# Patient Record
Sex: Female | Born: 1970 | Race: White | Hispanic: No | Smoking: Current every day smoker
Health system: Southern US, Community
[De-identification: ages and names within clinical notes are randomized; demographics above are authoritative.]

## PROBLEM LIST (undated history)

## (undated) DIAGNOSIS — F319 Bipolar disorder, unspecified: Secondary | ICD-10-CM

## (undated) DIAGNOSIS — I10 Essential (primary) hypertension: Secondary | ICD-10-CM

## (undated) DIAGNOSIS — E039 Hypothyroidism, unspecified: Secondary | ICD-10-CM

## (undated) HISTORY — PX: TONSILLECTOMY: SUR1361

## (undated) HISTORY — PX: CHOLECYSTECTOMY: SHX55

## (undated) HISTORY — DX: Hypothyroidism, unspecified: E03.9

---

## 2006-05-31 ENCOUNTER — Ambulatory Visit: Payer: Self-pay | Admitting: Gastroenterology

## 2006-06-07 ENCOUNTER — Ambulatory Visit (HOSPITAL_COMMUNITY): Admission: RE | Admit: 2006-06-07 | Discharge: 2006-06-07 | Payer: Self-pay | Admitting: Gastroenterology

## 2006-06-08 ENCOUNTER — Ambulatory Visit: Payer: Self-pay | Admitting: Internal Medicine

## 2006-06-08 ENCOUNTER — Ambulatory Visit (HOSPITAL_COMMUNITY): Admission: RE | Admit: 2006-06-08 | Discharge: 2006-06-08 | Payer: Self-pay | Admitting: Internal Medicine

## 2006-06-14 ENCOUNTER — Encounter (INDEPENDENT_AMBULATORY_CARE_PROVIDER_SITE_OTHER): Payer: Self-pay | Admitting: General Surgery

## 2006-06-14 ENCOUNTER — Ambulatory Visit (HOSPITAL_COMMUNITY): Admission: RE | Admit: 2006-06-14 | Discharge: 2006-06-14 | Payer: Self-pay | Admitting: General Surgery

## 2006-09-29 ENCOUNTER — Ambulatory Visit (HOSPITAL_COMMUNITY): Admission: RE | Admit: 2006-09-29 | Discharge: 2006-09-29 | Payer: Self-pay | Admitting: Gastroenterology

## 2006-09-29 ENCOUNTER — Ambulatory Visit: Payer: Self-pay | Admitting: Gastroenterology

## 2007-04-19 ENCOUNTER — Ambulatory Visit: Payer: Self-pay | Admitting: Gastroenterology

## 2007-11-17 ENCOUNTER — Encounter: Admission: RE | Admit: 2007-11-17 | Discharge: 2007-11-17 | Payer: Self-pay | Admitting: Neurosurgery

## 2009-01-22 DIAGNOSIS — K2990 Gastroduodenitis, unspecified, without bleeding: Secondary | ICD-10-CM

## 2009-01-22 DIAGNOSIS — G43909 Migraine, unspecified, not intractable, without status migrainosus: Secondary | ICD-10-CM | POA: Insufficient documentation

## 2009-01-22 DIAGNOSIS — D649 Anemia, unspecified: Secondary | ICD-10-CM

## 2009-01-22 DIAGNOSIS — F329 Major depressive disorder, single episode, unspecified: Secondary | ICD-10-CM | POA: Insufficient documentation

## 2009-01-22 DIAGNOSIS — K2970 Gastritis, unspecified, without bleeding: Secondary | ICD-10-CM | POA: Insufficient documentation

## 2009-01-22 DIAGNOSIS — K8066 Calculus of gallbladder and bile duct with acute and chronic cholecystitis without obstruction: Secondary | ICD-10-CM | POA: Insufficient documentation

## 2009-01-22 DIAGNOSIS — Z8719 Personal history of other diseases of the digestive system: Secondary | ICD-10-CM

## 2009-01-22 DIAGNOSIS — K449 Diaphragmatic hernia without obstruction or gangrene: Secondary | ICD-10-CM | POA: Insufficient documentation

## 2009-01-28 ENCOUNTER — Ambulatory Visit: Payer: Self-pay | Admitting: Gastroenterology

## 2009-01-28 ENCOUNTER — Encounter: Payer: Self-pay | Admitting: Gastroenterology

## 2009-01-28 DIAGNOSIS — R1011 Right upper quadrant pain: Secondary | ICD-10-CM

## 2009-01-28 DIAGNOSIS — R142 Eructation: Secondary | ICD-10-CM

## 2009-01-28 DIAGNOSIS — R143 Flatulence: Secondary | ICD-10-CM

## 2009-01-28 DIAGNOSIS — R141 Gas pain: Secondary | ICD-10-CM

## 2009-01-29 ENCOUNTER — Encounter: Payer: Self-pay | Admitting: Internal Medicine

## 2009-01-29 LAB — CONVERTED CEMR LAB
ALT: 22 units/L (ref 0–35)
AST: 14 units/L (ref 0–37)
Albumin: 4.4 g/dL (ref 3.5–5.2)
Alkaline Phosphatase: 89 units/L (ref 39–117)
Basophils Relative: 1 % (ref 0–1)
Eosinophils Relative: 3 % (ref 0–5)
Lymphs Abs: 3.5 10*3/uL (ref 0.7–4.0)
MCHC: 33.2 g/dL (ref 30.0–36.0)
MCV: 94.7 fL (ref 78.0–100.0)
Monocytes Absolute: 0.3 10*3/uL (ref 0.1–1.0)
Monocytes Relative: 3 % (ref 3–12)
Neutro Abs: 5.7 10*3/uL (ref 1.7–7.7)
Neutrophils Relative %: 58 % (ref 43–77)
Platelets: 339 10*3/uL (ref 150–400)
Potassium: 4.7 meq/L (ref 3.5–5.3)
RDW: 13.6 % (ref 11.5–15.5)
WBC: 9.9 10*3/uL (ref 4.0–10.5)

## 2009-01-31 ENCOUNTER — Ambulatory Visit (HOSPITAL_COMMUNITY): Admission: RE | Admit: 2009-01-31 | Discharge: 2009-01-31 | Payer: Self-pay | Admitting: Internal Medicine

## 2009-03-27 ENCOUNTER — Encounter (INDEPENDENT_AMBULATORY_CARE_PROVIDER_SITE_OTHER): Payer: Self-pay | Admitting: *Deleted

## 2010-02-25 NOTE — Letter (Signed)
Summary: CT SCAN OF THE ABD/PELVIS ORDER  CT SCAN OF THE ABD/PELVIS ORDER   Imported By: Ave Filter 01/29/2009 12:45:50  _____________________________________________________________________  External Attachment:    Type:   Image     Comment:   External Document

## 2010-02-25 NOTE — Assessment & Plan Note (Signed)
Summary: ABD PAIN/CM   Visit Type:  F/U Primary Care Provider:  Ninfa Linden  Chief Complaint:  abdominal pain.  History of Present Illness: Miss Eileen [name] is a pleasant 40 year old Caucasian female who presents today for further evaluation of abdominal pain. She was last seen in March of 2009 for abd pain. She was seen by Dr. Darrick Penna and was treated for functional gut disorder and gastritis. Hyoscyamine did not help. 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 She had laparoscopic cholecystectomy in May of 2008, on pathology showed chronic cholecystitis and cholelithiasis.  She c/o intermittent RUQ pain which has been worse the last few months. She feels like a knot and swelling in this area and feels a burning sensation. She started on Carafate in November and seems to help some. Pain is worse postprandially but it doesn't matter what she eats. She denies heartburn. She's taking Prilosec 20mg  every morning but eats five hours later. She has a bowel movement every other day but feels bloated. She denies having hard stools. She takes MiraLax daily. Denies blood in the stool or melena. She denies prior colonoscopy.  Current Medications (verified): 1)  Effexor Xr 150 Mg Xr24h-Cap (Venlafaxine Hcl) .... Take 3 Tablets Daily 2)  Lamictal 100 Mg Tabs (Lamotrigine) .... Three Tablets Daily 3)  Tylenol 325 Mg Tabs (Acetaminophen) .... As Needed 4)  Prilosec 20 Mg Cpdr (Omeprazole) .... Take 1 Tablet By Mouth Once A Day 5)  Asa 81 Mg .... Take 1 Tablet By Mouth Once A Day 6)  Carafate 1 Gm Tabs (Sucralfate) .... Take 1 Tablet By Mouth Four Times A Day 7)  Vitamin D .... 50,000 Units Twice Per Week 8)  Glycolax  Powd (Polyethylene Glycol 3350) .Marland KitchenMarland KitchenMarland Kitchen 17 Grams By Mouth Daily  Allergies (verified): 1)  ! Penicillin  Past History:  Past Surgical History: Last updated: 01/22/2009 LAPAROSCOPIC CHOLECYSTECTOMY  2008 BILATERAL TUBAL LIGATION TONSILLECTOMY  Past Medical History: Bipolar disorder GERD EGD, April 2008, Dr. Samuella Cota, reported to show gastritis.  Biopsy was negative H. Pylori. ERCP with biliary sphincterotomy, placement of biliary and pancreatic stents on May 2008. She had large stone in the neck of the gallbladder causing biliary obstruction via external compression, Mirrizi's syndrome. Sphincterotomy was performed and placement of both biliary and pancreatic stents. EGD with removal of bile duct stent, September 2008 Dr. Kassie Mends. Bile duct stent was removed from the ampulla. Pancreatic duct stent had passed on its own. Esophagus was normal. She had a 2-3 cm hiatal hernia. Normal duodenal bulb and second portion of the duodenum.  Family History: Father, pancreatitis, unknown cause Mother, stomach issues,breast cancer age 72 Brother, stomach issues No known FH of CRC.  Social History: Widowed. Four children 16, 13, 11, 10 Tob 1ppd.  Etoh 3-4 times per year. Marijuana use daily. Unemployed, stay-at-home mom  Review of Systems General:  Denies fever, chills, fatigue, weakness, malaise, and weight loss. Eyes:  Denies vision loss. ENT:  Denies nasal congestion, sore throat, hoarseness, and difficulty swallowing. CV:  Denies chest pains, angina, syncope, dyspnea on exertion, and peripheral edema. Resp:  Denies dyspnea at rest, dyspnea with exercise, and cough. GI:  See HPI. GU:  Denies urinary burning and blood in urine. MS:  Denies joint pain / LOM. Derm:  Denies rash and itching. Neuro:  Denies weakness, paralysis, abnormal sensation, frequent headaches, memory loss, and confusion. Psych:  Denies depression and anxiety. Endo:  Denies unusual weight change. Heme:  Denies bruising and bleeding. Allergy:  Denies hives and rash.  Vital Signs:  Patient profile:   40 year old female Height:      64 inches Weight:      191 pounds BMI:     32.90 Temp:     98.0 degrees F  oral Pulse rate:   60 / minute BP sitting:   122 / 82  (left arm) Cuff size:   regular  Vitals Entered By: Cloria Spring LPN (January 28, 2009 11:01 AM)  Physical Exam  General:  Well developed, well nourished, no acute distress. Head:  Normocephalic and atraumatic. Eyes:  Conjunctivae pink, no scleral icterus.  Mouth:  Oropharyngeal mucosa moist, pink.  No lesions, erythema or exudate.    Neck:  Supple; no masses or thyromegaly. Lungs:  Clear throughout to auscultation. Heart:  Regular rate and rhythm; no murmurs, rubs,  or bruits. Abdomen:  Abd obese. Soft. Normal bowel sounds.  Moderate RUQ tenderness. No masses or hernia noted. No abd bruits. No rebound or guarding. Extremities:  No clubbing, cyanosis, edema or deformities noted. Neurologic:  Alert and  oriented x4;  grossly normal neurologically. Skin:  Intact without significant lesions or rashes. Cervical Nodes:  No significant cervical adenopathy. Psych:  Alert and cooperative. Normal mood and affect.  Impression & Recommendations:  Problem # 1:  ABDOMINAL PAIN RIGHT UPPER QUADRANT (ICD-789.01) Worsening RUQ tenderness with h/o previous cholecystectomy and ERCP as above.  Last EGD in 2009 unremarkable.  Also with c/o RUQ swelling and abd bloating.  Given chronicity of symptoms and lack of response to PPI and antispasmotics, will pursue further w/u.  Will increase Prilosec.  Advised to take before meals.  Orders: Est. Patient Level IV (27253) T-CBC w/Diff (66440-34742) T-Comprehensive Metabolic Panel [phone]) T-Lipase (33295-18841)  Problem # 2:  CONSTIPATION, HX OF (ICD-V12.79) Continue Miralax.  Increase dietary fiber and consume adequate noncaffeinated beverages daily.  Encouraged daily excersize. Add Restora for three weeks.  Orders: Est. Patient Level IV (66063) T-CBC w/Diff (01601-09323) T-Comprehensive Metabolic Panel (55732-20254)  Patient Instructions: 1)  Please have labwork done today. 2)  CT  abdomen/pelvis as scheduled. 3)  Take Restora once daily. Samples given. 4)  Take Prilosec twice a day.  Samples given.  New prescription sent to [facility]. 5)  The medication list was reviewed and reconciled.  All changed / newly prescribed medications were explained.  A complete medication list was provided to the patient / caregiver. Prescriptions: PRILOSEC 20 MG CPDR (OMEPRAZOLE) Take 1 tablet by mouth 30 mins before breakfast and before evening meal  #60 x 11   Entered and Authorized by:   Leanna Battles. Dixon Boos   Signed by:   Leanna Battles Dixon Boos on 01/28/2009   Method used:   Electronically to        [facility] (retail)       [address]       [place], [place]  [address]       Ph: [phone]       Fax: [phone]   RxID:   [id number]

## 2010-02-25 NOTE — Letter (Signed)
Summary: Appointment Reminder  [facility]  [address]   [place], [place] [address]   Phone: [phone]  Fax: [phone]       March 27, 2009   [facility] [address] [place], [place]  [address] Jun 07, 1970    Dear Ms. Soave,  We have been unable to reach you by phone to schedule a follow up   appointment that was recommended for you by Dr. Darrick Penna. It is very   important that we reach you to schedule an appointment. We hope that you  allow [place] to participate in your health care needs. Please contact us at  [phone] at your earliest convenience to schedule your appointment.  Sincerely,    [facility] R. Roetta Sessions, M.D.    Kassie Mends, M.D. Lorenza Burton, FNP-BC    Tana Coast, PA-C Phone: [phone]    Fax: [phone]

## 2010-06-10 NOTE — Assessment & Plan Note (Signed)
Eileen [name], Eileen [name]                  CHART#:  [id number]   DATE:  04/19/2007                       DOB:  11/04/1970   PROBLEM LIST:  1. 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  2. Laparoscopic cholecystectomy in May of 2008 and pathology showed      chronic cholecystitis and cholelithiasis.  3. Hiatal hernia.   SUBJECTIVE:  The patient is a 40 year old female who was last seen in  September of 2008.  She was seen for an upper endoscopy to have her bile  duct stent removed.  It was placed in May of 2008 and removed in  September of 2008 because of her personal reasons.  She presents today  complaining of abdominal pain which is off and on and usually associated  with stress.  It flares up when her four children are bothering her.  She feels it in the right upper quadrant and sometimes in the right  chest.  It is sharp.  It does not radiate and is associated with nausea.  Sometimes she vomits.  Crackers help with the nausea.  She denies any  fever or diarrhea.  She also complains of bloating after she eats.  Liquids do not make her feel bloated.  She stays constipated and  occasionally needs to use a laxative.  She has one bowel movement every  3-4 days.  She denies any blood in her stool or black tarry stools.  She  does not use BC or Goody Powders.  She uses 7-8 ibuprofen per week.  She  denies any heartburn or indigestion.  She says she has had problems  swallowing since her tonsils were out 3-4 years ago.  She has had no  emergency room visits.  Her pain can last from 20 minutes to hours.  She  usually just lays down and it gets better.   MEDICATIONS:  1. Effexor.  2. Lamictal.  3. Clonazepam as needed.  4. Zyprexa.  5. Vitamin D.  6. Tylenol as needed.   OBJECTIVE:  VITAL SIGNS:  Weight 152 pounds (up 10 pounds since April of  2008), height 5 feet 4 inches, BMI 26.1 (slightly overweight),  temperature 98.1, blood pressure 112/78, and pulse 68.  GENERAL:  She is  in no apparent distress.  Alert and oriented x4.  LUNGS:  Clear to  auscultation bilaterally. CARDIOVASCULAR:  Regular rhythm.  No murmur.  ABDOMEN:  Bowel sounds are present, soft, mildly distended, nontender,  and no rebound or guarding.   ASSESSMENT:  The patient is a 40 year old female who likely has a  functional gut disorder.  She may have gastritis exacerbating her pain.  The episodes are mainly precipitated by stress, but she does have some  postprandial bloating.  Thank you for allowing me to see the patient in  consultation.   RECOMMENDATIONS:  1. She is to add Prilosec once daily for gastrointestinal prophylaxis      with continued NSAIDS use.  2. She should avoid gastric irritants.  She is given a handout on      gastric irritants.  3. She is to use hyoscyamine under her tongue if she is in a stressful      situation or if she  has abdominal pain.  I did warn her that the      side effects include constipation, drowsiness, dry eyes, dry mouth,      urinary retention.  4. Return visit in 2 months.       Kassie Mends, M.D.  Electronically Signed     SM/MEDQ  D:  04/19/2007  T:  04/20/2007  Job:  [id number]

## 2010-06-10 NOTE — H&P (Signed)
NAME:  Eileen [name], Eileen [name]                 ACCOUNT NO.:  [id number]   MEDICAL RECORD NO.:  [id number]          PATIENT TYPE:  AMB   LOCATION:  DAY                           FACILITY:  [facility]   PHYSICIAN:  Dalia Heading, M.D.  DATE OF BIRTH:  1970-06-22   DATE OF ADMISSION:  DATE OF DISCHARGE:  LH                              HISTORY & PHYSICAL   CHIEF COMPLAINT:  Cholecystitis, cholelithiasis.   HISTORY OF PRESENT ILLNESS:  The patient is a 40 year old white female  who is referred for evaluation and treatment of biliary colic secondary  to cholelithiasis.  She has been having right upper quadrant abdominal  pain, nausea and bloating for one week.  She underwent ERCP with stent  placement by Dr. Karilyn Cota earlier this week and was found to have  Mirizzi's syndrome.  Some fatty food intolerance is noted.  No fever,  chills, jaundice has been noted.   PAST MEDICAL HISTORY:  Anxiety disorder.   PAST SURGICAL HISTORY:  Tubal ligation, tonsillectomy.   CURRENT MEDICATIONS:  Effexor, Lamictal, Clonazepam, oxycodone.   ALLERGIES:  PENICILLIN which she states does not work with her.   REVIEW OF SYSTEMS:  The patient smokes two packs of cigarettes a day.  She drinks alcohol socially.  She denies any other cardiopulmonary  difficulties or bleeding disorders.   PHYSICAL EXAMINATION:  GENERAL:  The patient is a well-developed, well-  nourished white female in no acute distress.  HEENT:  Reveals no scleral icterus.  LUNGS:  Clear to auscultation with equal breath sounds bilaterally.  HEART:  Examination reveals a regular rate and rhythm without S3, S4 or  murmurs.  ABDOMEN:  Soft and non-distended.  She is tender in the right upper  quadrant to palpation.  No hepatosplenomegaly, masses, or hernias are  noted.   Ultrasound reveals cholelithiasis with a dilated common bile duct which  has been treated.   IMPRESSION:  Cholecystitis, cholelithiasis.   PLAN:  The patient is scheduled for  laparoscopic cholecystectomy on  06/14/06.  Risks and benefits of the procedure including bleeding,  infection, hepatobiliary injury, the possibility of an open procedure  were fully explained to the patient who gave informed consent.      Dalia Heading, M.D.  Electronically Signed     MAJ/MEDQ  D:  06/10/2006  T:  06/10/2006  Job:  [id number]   cc:   Lionel December, M.D.  [address]  [place]  [place] [address]

## 2010-06-10 NOTE — Op Note (Signed)
NAME:  [name], Eileen                 ACCOUNT NO.:  [id number]   MEDICAL RECORD NO.:  [id number]          PATIENT TYPE:  AMB   LOCATION:  DAY                           FACILITY:  [facility]   PHYSICIAN:  Dalia Heading, M.D.  DATE OF BIRTH:  08/14/1970   DATE OF PROCEDURE:  06/14/2006  DATE OF DISCHARGE:                               OPERATIVE REPORT   PREOPERATIVE DIAGNOSIS:  Cholecystitis, cholelithiasis.   POSTOPERATIVE DIAGNOSIS:  Cholecystitis, cholelithiasis.   PROCEDURE:  Laparoscopic cholecystectomy.   SURGEON:  Dr. Franky Macho.   ANESTHESIA:  General endotracheal.   INDICATIONS:  The patient is a 40 year old white female status post ERCP  with stent placement for Mirizzi's syndrome.  The patient now comes to  the operating room for laparoscopic cholecystectomy.  The risks and  benefits of the procedure including bleeding, infection, hepatobiliary  injury, and the possibly of an open procedure were fully explained to  the patient.  I gave informed consent.   PROCEDURE NOTE:  The patient was placed in the supine position.  After  induction of general endotracheal anesthesia, the abdomen was prepped  and draped using the usual sterile technique with Betadine.  Surgical  site confirmation was performed.   An infraumbilical incision was made down to the fascia.  Veress needle  was introduced into the abdominal cavity, and confirmation of placement  was done using the saline drop test.  The abdomen was then insufflated  to 16 mmHg pressure.  An 11-mm trocar was introduced into the abdominal  cavity under direct visualization without difficulty.  The patient was  placed in a reversed Trendelenburg position.  An additional 11-mm trocar  was placed in the epigastric region, and 5-mm trocars were placed in the  right upper quadrant and right flank regions.  The liver was inspected  and noted to be within normal limits.  The gallbladder was retracted  superior and laterally.  The  dissection was begun around the  infundibulum of the gallbladder.  A large stone was noted be impacted in  the neck of the gallbladder.  It was freed up to the lumen of the  fundus.  The cystic duct was first identified.  The juncture to the  infundibulum was fully identified.  A standard Endo-GIA was placed  across the cystic duct to its significant size.  The common duct was  visualized during the firing of the GIA.  The cystic artery was likewise  ligated and divided.  The gallbladder was freed away from the  gallbladder fossa using Bovie electrocautery.  The gallbladder was  delivered into the epigastric trocar site using an EndoCatch bag.  The  gallbladder fossa was inspected.  No abnormal bleeding or bile leakage  was noted.  Surgicel was placed in the gallbladder fossa.  All fluid and  air were then evacuated from the abdominal cavity prior to removal of  the trocars.   All wounds were irrigated normal saline.  All wounds were injected with  0.5 % Sensorcaine.  The infraumbilical fascia as well as epigastric  fascia were reapproximated using 0 Vicryl  interrupted sutures.  All skin  incisions were closed using staples.  Betadine ointment and dry sterile  dressings were applied.   All tape and needle counts were correct at the end of the procedure.  The patient was extubated in the operating room and went back to  recovery room, awake in stable condition.   COMPLICATIONS:  None.   SPECIMEN:  Gallbladder stone.   BLOOD LOSS:  Minimal.      Dalia Heading, M.D.  Electronically Signed     MAJ/MEDQ  D:  06/14/2006  T:  06/14/2006  Job:  [id number]   cc:   Lionel December, M.D.  [address]  [place]  [place] [address]

## 2010-06-10 NOTE — Op Note (Signed)
NAME:  Sia, Jeri                 ACCOUNT NO.:  [id number]   MEDICAL RECORD NO.:  [id number]          PATIENT TYPE:  AMB   LOCATION:  DAY                           FACILITY:  [facility]   PHYSICIAN:  Lionel December, M.D.    DATE OF BIRTH:  February 10, 1970   DATE OF PROCEDURE:  06/08/2006  DATE OF DISCHARGE:                               OPERATIVE REPORT   PROCEDURE:  ERCP with biliary sphincterotomy, placement of biliary and  pancreatic stents.   INDICATION:  Justeen is a 40 year old Caucasian female who was seen in  the office by Dr. Cira Servant last week.  She has been having right upper  quadrant pain.  She had ultrasound done in [place], [place] and noted  to have cholelithiasis.  She had lab studies yesterday.  She had  abdominal CT yesterday which revealed dilated intrahepatic and common  hepatic duct with a large calcified stone. CBD below this was normal.  She also had small stones in gallbladder.  The patient was advised  admission because of pain control but she had abdominal CT yesterday  which shows dilated intrahepatic system as well as dilated common  hepatic duct with large stone. Distal CBD was normal.  Also had small  dilated intrahepatic biliary radicals, dilated common hepatic duct with  a stone which appears to be in the distal hepatic duct.  The CBD was  normal.  She is therefore undergoing a therapeutic ERCP.  I reviewed the  procedure and risks with the patient at length.  I told her that the  pancreatic stenting may be needed to reduce the risk of pancreatitis if  procedure was difficult.  A biliary stenting would be considered if the  stone cannot be removed.  Procedure risks were reviewed with the patient  in length and she is agreeable.   MEDS FOR CONSCIOUS SEDATION:  Please see anesthesia record for details.  She is agreeable.   ANESTHESIA:  Please see anesthesia record for details.   FINDINGS:  Procedure performed in the OR.  The patient was given general  anesthesia. She was intubated and positioned in semi prone position.  Therapeutic Pentax video duodenoscope was passed via oropharynx without  any difficulty into esophagus and stomach.  Mucosa of antrum and pyloric  channel was normal.  Scope was passed across the pylorus into bulb and  descending duodenum.  Ampulla Vater was normal.  Cannulation was  attempted with RX44 autotome and 0.35 high-torque Jag wire.  The  pancreatic duct was initially cannulated and gently filled with dilute  contrast and had normal caliber.  Selective cannulation of bile duct was  difficult.  It was initially attempted leaving the guidewire in the  pancreatic duct but did not work.  CBD was finally selectively  cannulated in a relatively Quang position.  Dilute contrast was injected.  The distal CBD was normal.  There was filling defect at the junction.  There was spilling of contrast into the cystic duct.  This filling  defect was just proximal to entry of the cystic duct into the bile duct.  This filling defect appeared  to be in the cystic duct or neck of the  gallbladder rather than the bile duct.  Guidewire was passed proximal to  this area and catheter was advanced and more contrast injected outlining  the intrahepatic biliary radicals and hepatic duct. Both the  intrahepatic system and CHD were markedly dilated.  Sphincterotomy was  performed dormia basket was trolled through the common hepatic duct and  bile duct in open position but no stone was removed.  With this maneuver  the stone actually moved away from the bile duct and this was well  documented on the x-ray images.  Given this finding I proceeded with  placement of biliary stents.  10-French 10-cm Barra stent was placed  using the one-step Microvasive system.  Proximal margin of the stent was  well above the level of obstruction.  Subsequently a pancreatic duct was  cannulated and 5 cm Mcquerry single placed at level of obstruction.  Subsequently  pancreatic duct was recannulated and 4 French 5 cm single  pigtail stent was placed across the pancreatic sphincter.  Endoscope was  withdrawn.  The patient tolerated the procedure well.   FINAL DIAGNOSIS:  The patient has a large stone which is in the neck of  the gallbladder causing biliary obstruction via external compression.  The patient has Mirizzi's syndrome.  Sphincterotomy performed and 10-  French 10-cm Mena biliary stent placed to alleviate biliary obstruction.   4-French 5-cm Flakes single pigtail pancreatic stent placed to reduce the  risk of pancreatitis.   RECOMMENDATIONS:  The patient will go home if she does well in recovery,  otherwise will be admitted overnight.  We will make arrangements for her  to see a surgeon for cholecystectomy once she had decided where she  would like to have surgery.  I would recommend removing the biliary  stent following cholecystectomy.  Pancreatic stent will be removed in 2  weeks unless it passes spontaneously.      Lionel December, M.D.  Electronically Signed     NR/MEDQ  D:  06/08/2006  T:  06/09/2006  Job:  [id number]

## 2010-06-10 NOTE — Op Note (Signed)
NAME:  Eileen [name], Eileen [name]                 ACCOUNT NO.:  [id number]   MEDICAL RECORD NO.:  [id number]          PATIENT TYPE:  AMB   LOCATION:  DAY                           FACILITY:  [facility]   PHYSICIAN:  Kassie Mends, M.D.      DATE OF BIRTH:  05/22/1970   DATE OF PROCEDURE:  09/29/2006  DATE OF DISCHARGE:                               OPERATIVE REPORT   PROCEDURE:  Esophagogastroduodenoscopy with removal of bile duct stent.   INDICATION FOR EXAM:  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  She had a 10-French 10 cm stent  placed.  She also had a pancreatic duct stent placed.  Due to personal  reasons, she was unable to return to have her stent removed before now.  She presents today for bile duct stent removed.   FINDINGS:  1. Bile duct stent extending from the ampulla.  No pancreatic duct      stent seen.  Bile duct stent removed via rat tooth forceps.  Unable      to place a snare around the end of the stent.  Prior to and after      the bile duct stent was removed, bile staining seen in the second      portion of the duodenum.  2. Normal esophagus without evidence of Barrett's, mass, stricture,      erosions or ulcerations.  3. 2-3 cm hiatal hernia.  Otherwise, normal stomach.  4. Normal duodenal bulb and second portion of the duodenum.   RECOMMENDATIONS:  1. She may resume her previous diet.  2. Follow up with Dr. Kassie Mends as needed.   MEDICATIONS:  1. Demerol 100 mg IV.  2. Versed 7 mg IV.   PROCEDURE TECHNIQUE:  Physical exam was performed.  Informed consent was  obtained from the patient after explaining the benefits, risks and  alternatives to the procedure.  The patient was connected to the monitor  and placed in the left lateral position.  Continuous oxygen was provided  by nasal cannula and IV medicine administered through an indwelling  cannula.  After administration of sedation, the patient's esophagus was  intubated and  the  scope was advanced under direct visualization to the second portion of  the duodenum.  The scope was removed slowly by carefully examining the  color, texture, anatomy and integrity of the mucosa on the way out after  securing the bile duct stent.  The patient was recovered in endoscopy  and discharged home in satisfactory condition.      Kassie Mends, M.D.  Electronically Signed     SM/MEDQ  D:  09/29/2006  T:  09/29/2006  Job:  [id number]

## 2010-08-14 ENCOUNTER — Ambulatory Visit: Payer: Medicaid Other | Attending: Nurse Practitioner

## 2010-08-14 DIAGNOSIS — Z6834 Body mass index (BMI) 34.0-34.9, adult: Secondary | ICD-10-CM | POA: Insufficient documentation

## 2010-08-14 DIAGNOSIS — G4730 Sleep apnea, unspecified: Secondary | ICD-10-CM | POA: Insufficient documentation

## 2010-08-14 DIAGNOSIS — G4710 Hypersomnia, unspecified: Secondary | ICD-10-CM | POA: Insufficient documentation

## 2010-08-21 NOTE — Procedures (Signed)
NAME:  Raborn, Aleiya                 ACCOUNT NO.:  [id number]  MEDICAL RECORD NO.:  [id number]          PATIENT TYPE:  OUT  LOCATION:  SLEEP LAB                     FACILITY:  [facility]  PHYSICIAN:  Bleu Minerd A. Gerilyn Pilgrim, M.D. DATE OF BIRTH:  01-12-1971  DATE OF STUDY:  08/14/2010                           NOCTURNAL POLYSOMNOGRAM  REFERRING PHYSICIAN:  Ninfa Linden, FNP  INDICATIONS:  40 year old presents with fatigue, hypersomnia, weight gain and snoring.  This has been done to evaluate for obstructive sleep apnea syndrome.  MEDICATIONS:  Levothyroxine, vitamin D, Prilosec, Lamictal, magnesium.  EPWORTH SLEEPINESS SCALE:  9.  BMI:  34.  ARCHITECTURAL SUMMARY:  The total recording time is 507 minutes.  Sleep efficiency 69%, sleep latency 80 minutes.  REM latency 210 minutes. Stage N1 3.4%, N2 60%, N3 70% and REM sleep 19.3%.  The CSN number [id number].  RESPIRATORY SUMMARY:  Baseline oxygen saturation is 95%, lowest saturation 93%, AHI 0.5 and RDI 3.6.  LIMB MOVEMENT SUMMARY:  PLM index 0.  ELECTROCARDIOGRAM SUMMARY:  Average heart rate is 55 with no significant dysrhythmias observed.  IMPRESSION:  Unremarkable nocturnal polysomnography.  Thanks for this referral.   Irva Loser A. Gerilyn Pilgrim, M.D.    KAD/MEDQ  D:  08/21/2010 09:02:07  T:  08/21/2010 09:17:13  Job:  [id number]

## 2010-11-07 LAB — HEPATIC FUNCTION PANEL
AST: 16
Alkaline Phosphatase: 80
Indirect Bilirubin: 0.5
Total Bilirubin: 0.6
Total Protein: 6

## 2014-07-09 ENCOUNTER — Other Ambulatory Visit (HOSPITAL_COMMUNITY): Payer: Self-pay | Admitting: Respiratory Therapy

## 2014-07-09 ENCOUNTER — Other Ambulatory Visit (HOSPITAL_COMMUNITY): Payer: Self-pay | Admitting: Physician Assistant

## 2014-07-09 DIAGNOSIS — R0683 Snoring: Secondary | ICD-10-CM

## 2014-07-09 DIAGNOSIS — G4730 Sleep apnea, unspecified: Secondary | ICD-10-CM

## 2014-07-31 ENCOUNTER — Ambulatory Visit: Payer: Medicaid Other | Attending: Physician Assistant | Admitting: Sleep Medicine

## 2014-07-31 DIAGNOSIS — R0683 Snoring: Secondary | ICD-10-CM | POA: Diagnosis not present

## 2014-07-31 DIAGNOSIS — G4730 Sleep apnea, unspecified: Secondary | ICD-10-CM

## 2014-08-15 NOTE — Sleep Study (Signed)
  [facility] Rasmus Preusser A. Gerilyn Pilgrimoonquah, MD     [email or web address]             NOCTURNAL POLYSOMNOGRAPHY   LOCATION: [facility]  Demographics Edit Patient Name: Eileen [name], Eileen [name] Study Date: 07/31/2014 Gender: Female D.O.B: 10-17-1970 Age (years): 44 Referring Provider: Quinn AxeAnthony T Robertson Height (inches): 64 Interpreting Physician: Beryle BeamsKofi Porter Nakama MD, ABSM Weight (lbs): 220 RPSGT: Sharee HolsterDodson, Jeanette BMI: 38 MRN:  Neck Size: 15.50   CLINICAL INFORMATION  Sleep Study Type: NPSG Indication for sleep study: Snoring Epworth Sleepiness Score:  SLEEP STUDY TECHNIQUE  As per the AASM Manual for the Scoring of Sleep and Associated Events v2.3 (April 2016) with a hypopnea requiring 4% desaturations. The channels recorded and monitored were frontal, central and occipital EEG, electrooculogram (EOG), submentalis EMG (chin), nasal and oral airflow, thoracic and abdominal wall motion, anterior tibialis EMG, snore microphone, electrocardiogram, and pulse oximetry. MEDICATIONS EditMoveRemove this section Patient's medications include: N/A. Medications self-administered by patient during sleep study : No sleep medicine administered.  SLEEP ARCHITECTURE  The study was initiated at 10:44:49 PM and ended at 4:45:28 AM. Sleep onset time was 62.4 minutes and the sleep efficiency was 74.8%. The total sleep time was 269.8 minutes. Stage REM latency was 88.5 minutes. The patient spent 7.04% of the night in stage N1 sleep, 71.83% in stage N2 sleep, 7.97% in stage N3 and 13.16% in REM. Alpha intrusion was absent. Supine sleep was 40.88%.  RESPIRATORY PARAMETERS  The overall apnea/hypopnea index (AHI) was 0.0 per hour. There were 0 total apneas, including 0 obstructive, 0 central and 0 mixed apneas. There were 0 hypopneas and 0 RERAs. The AHI during Stage REM sleep was 0.0 per hour. AHI while supine was 0.0 per hour. The mean oxygen saturation was 94.82%. The minimum SpO2 during sleep was 92.00%. Loud  snoring was noted during this study.  CARDIAC DATA EditMoveRemove this section The 2 lead EKG demonstrated sinus rhythm. The mean heart rate was N/A beats per minute. Other EKG findings include: None. LEG MOVEMENT DATA EditMoveRemove this section The total PLMS were 6 with a resulting PLMS index of 1.33. Associated arousal with leg movement index was 0.2 .  IMPRESSIONS  No significant obstructive sleep apnea occurred during this study (AHI = 0.0/h). No significant central sleep apnea occurred during this study (CAI = 0.0/h). The patient had minimal or no oxygen desaturation during the study (Min O2 = 92.00%) The patient snored with Loud snoring volume. No cardiac abnormalities were noted during this study. Clinically significant periodic limb movements did not occur during sleep. No significant associated arousals.      Argie RammingKofi A Maili Shutters, MD Diplomate, American Board of Sleep Medicine.

## 2014-08-20 ENCOUNTER — Other Ambulatory Visit (HOSPITAL_COMMUNITY): Payer: Self-pay | Admitting: Respiratory Therapy

## 2015-11-28 ENCOUNTER — Ambulatory Visit (INDEPENDENT_AMBULATORY_CARE_PROVIDER_SITE_OTHER): Payer: Medicaid Other | Admitting: "Endocrinology

## 2015-11-28 ENCOUNTER — Encounter: Payer: Self-pay | Admitting: "Endocrinology

## 2015-11-28 VITALS — BP 126/78 | HR 70 | Resp 18 | Ht 64.0 in | Wt 217.0 lb

## 2015-11-28 DIAGNOSIS — R7303 Prediabetes: Secondary | ICD-10-CM | POA: Diagnosis not present

## 2015-11-28 DIAGNOSIS — E049 Nontoxic goiter, unspecified: Secondary | ICD-10-CM

## 2015-11-28 DIAGNOSIS — E6609 Other obesity due to excess calories: Secondary | ICD-10-CM | POA: Diagnosis not present

## 2015-11-28 DIAGNOSIS — Z6837 Body mass index (BMI) 37.0-37.9, adult: Secondary | ICD-10-CM

## 2015-11-28 DIAGNOSIS — F17200 Nicotine dependence, unspecified, uncomplicated: Secondary | ICD-10-CM

## 2015-11-28 DIAGNOSIS — IMO0001 Reserved for inherently not codable concepts without codable children: Secondary | ICD-10-CM

## 2015-11-28 DIAGNOSIS — E039 Hypothyroidism, unspecified: Secondary | ICD-10-CM | POA: Insufficient documentation

## 2015-11-28 DIAGNOSIS — E038 Other specified hypothyroidism: Secondary | ICD-10-CM

## 2015-11-28 MED ORDER — METFORMIN HCL 500 MG PO TABS: 500.0000 mg | ORAL_TABLET | Freq: Every day | ORAL | 6 refills | Status: DC

## 2015-11-28 MED ORDER — LEVOTHYROXINE SODIUM 50 MCG PO TABS: 50.0000 ug | ORAL_TABLET | Freq: Every day | ORAL | 2 refills | Status: DC

## 2015-11-28 NOTE — Progress Notes (Signed)
Subjective:    Patient ID: Eileen [name], female    DOB: 11/13/1970, PCP No primary care provider on file.  Past medical history: Hypothyroidism. Past surgical history: Cholecystectomy in 2008, tonsillectomy in 2003.  Social History   Social History  . Marital status: Widowed    Spouse name: N/A  . Number of children: N/A  . Years of education: N/A   Social History Main Topics  . Smoking status: Current Every Day Smoker    Packs/day: 1.00    Types: Cigarettes  . Smokeless tobacco: Never Used  . Alcohol use Yes     Comment: occasionally   . Drug use: No  . Sexual activity: Not Asked   Other Topics Concern  . None   Social History Narrative  . None   Outpatient Encounter Prescriptions as of 11/28/2015  Medication Sig  . atorvastatin (LIPITOR) 20 MG tablet Take 20 mg by mouth daily.  . Cholecalciferol (VITAMIN D-3) 1000 units CAPS Take 2 capsules by mouth.  . lamoTRIgine (LAMICTAL) 150 MG tablet Take 150 mg by mouth daily.  Marland Kitchen. levothyroxine (SYNTHROID, LEVOTHROID) 200 MCG tablet Take 200 mcg by mouth daily before breakfast.  . levothyroxine (SYNTHROID, LEVOTHROID) 50 MCG tablet Take 1 tablet (50 mcg total) by mouth daily before breakfast.  . magnesium gluconate (MAGONATE) 500 MG tablet Take 500 mg by mouth 2 (two) times daily.  Marland Kitchen. omeprazole (PRILOSEC) 40 MG capsule Take 40 mg by mouth daily.  . [DISCONTINUED] levothyroxine (SYNTHROID, LEVOTHROID) 75 MCG tablet Take 75 mcg by mouth daily before breakfast.  . metFORMIN (GLUCOPHAGE) 500 MG tablet Take 1 tablet (500 mg total) by mouth daily after supper.   No facility-administered encounter medications on file as of 11/28/2015.    ALLERGIES: Allergies  Allergen Reactions  . Codeine Itching  . Penicillins     REACTION: "doesn't help"   VACCINATION STATUS:  There is no immunization history on file for this patient.  HPI 45 year old female patient with medical history as above. She is being seen in consultation for  [place]-standing hypo-thyroidism requested by her PCP Lucia EstelleFeng Zheng, FNP. - She reports that she was diagnosed with hypothyroidism approximately 10 years ago. She required thyroid hormone replacement at various doses over the years. Currently she is taking levothyroxine 275 g by mouth every morning. She reports compliance in taking her thyroid hormone in the morning on empty stomach separated from antacids and PPIs. - She reports that she did have difficulty regulating her thyroid function tests. - She denies family history of thyroid dysfunction/thyroid cancer. She has 4 grown children, did not have hypothyroidism during any of those pregnancies. - She denies heat/cold intolerance. She is a chronic heavy smoker. She complains of weight gain which is progressive.  Review of Systems Constitutional: + weight gain, no fatigue, no subjective hyperthermia/hypothermia Eyes: no blurry vision, no xerophthalmia ENT: no sore throat, no nodules palpated in throat, no dysphagia/odynophagia, no hoarseness Cardiovascular: no CP/SOB/palpitations/leg swelling Respiratory: + cough,  -SOB Gastrointestinal: no N/V/D/C Musculoskeletal: no muscle/joint aches Skin: She has mild hirsutism on upper extremities and mustache which is not a new problem for her. Neurological: no tremors/numbness/tingling/dizziness Psychiatric: no depression/anxiety  Objective:    BP 126/78   Pulse 70   Resp 18   Ht 5\' 4"  (1.626 m)   Wt 217 lb (98.4 kg)   LMP 11/21/2015 (Approximate)   SpO2 97%   BMI 37.25 kg/m   Wt Readings from Last 3 Encounters:  11/28/15 217 lb (98.4 kg)  07/31/14 220 lb (99.8 kg)  01/28/09 191 lb (86.6 kg)    Physical Exam Constitutional: overweight, in NAD Eyes: PERRLA, EOMI, no exophthalmos ENT:  Grade 2 hirsutism on mustache, and upper extremities,  moist mucous membranes, mild  thyromegaly, no cervical lymphadenopathy Cardiovascular: Distant heart sounds, No MRG Respiratory: Tight chest with no  wheezing. Gastrointestinal: abdomen soft, NT, ND, BS+ Musculoskeletal: no deformities, strength intact in all 4 Skin: moist, warm, no rashes Neurological: no tremor with outstretched hands, DTR normal in all 4  CMP     Component Value Date/Time   NA 140 01/28/2009 2011   K 4.7 01/28/2009 2011   CL 106 01/28/2009 2011   CO2 22 01/28/2009 2011   GLUCOSE 86 01/28/2009 2011   BUN 8 01/28/2009 2011   CREATININE 0.57 01/28/2009 2011   CALCIUM 8.8 01/28/2009 2011   PROT 6.6 01/28/2009 2011   ALBUMIN 4.4 01/28/2009 2011   AST 14 01/28/2009 2011   ALT 22 01/28/2009 2011   ALKPHOS 89 01/28/2009 2011   BILITOT 0.3 01/28/2009 2011    Recent labs will be scanned. Her most recent thyroid function tests from 10/18/2015 showed free T4 above target at 1.54 (normal 0.7 6-1.46), TSH 4.36 (normal 0.3 5-3.74)  Her last 5 TSH measurements starting from March 2017 to September 2017 are as follows 6.5, 8.49, 0.81, 4.18, and 4.36.    Assessment & Plan:   1. Other specified hypothyroidism - This patient is being seen in kind request of her PCP Lucia EstelleFeng Zheng, FNP. - I have reviewed her available thyroid records and clinically evaluated this patient. She is on exceptionally large dose of thyroid hormone currently at 275 g of levothyroxine daily. - Reason for this may be poor gastric absorption or inconsistency/nonadherence. - I discussed the rare possibility of thyroid hormone resistance, even though this looks unlikely at this time. - Based on her most recent thyroid function test which is suggestive of  Slight over replacement, I will lower her thyroid hormone to 250 g by mouth every morning.   - We discussed about correct intake of levothyroxine, at fasting, with water, separated by at least 30 minutes from breakfast, and separated by more than 4 hours from calcium, iron, multivitamins, acid reflux medications (PPIs). -Patient is made aware of the fact that thyroid hormone replacement is needed for  life, dose to be adjusted by periodic monitoring of thyroid function tests. Her next labs will be 3 months from now.  2. Prediabetes - She is at high risk for type 2 diabetes and her recent A1c is 6.3%. I had a Belle discussion regarding 2 diabetes prevention/delay. I suggested for her to start caloric restriction in hope of weight loss and initiate low-dose metformin therapy 500 mg by mouth after supper. She agrees with this plan and I will prescribe.  3. Class 2 obesity due to excess calories with serious comorbidity and body mass index (BMI) of 37.0 to 37.9 in adult - See #2.  4. Current smoker - I have counseled her to consider smoking cessation. 5. Clinical goiter - I will request for dedicated thyroid/neck ultrasound to study the anatomy of thyroid better.    - 25 minutes of time was spent on the care of this patient , 50% of which was applied for counseling on diabetes complications and their preventions.  - I advised patient to maintain close follow up with No primary care provider on file. for primary care needs. Follow up plan: Return in about 3 months (around  02/28/2016) for follow up with pre-visit labs, Thyroid / Neck Ultrasound.  Marquis Lunch, MD Phone: [phone]  Fax: [phone]   11/28/2015, 5:55 PM

## 2016-02-24 ENCOUNTER — Ambulatory Visit (HOSPITAL_COMMUNITY): Admission: RE | Admit: 2016-02-24 | Payer: Medicaid Other | Source: Ambulatory Visit

## 2016-02-27 ENCOUNTER — Telehealth: Payer: Self-pay

## 2016-02-27 NOTE — Telephone Encounter (Signed)
Left message for pt to call radiology scheduling at [phone] to get thyroid u/s rescheduled. She no showed on 02-24-16.

## 2016-02-28 ENCOUNTER — Encounter: Payer: Self-pay | Admitting: Internal Medicine

## 2016-03-02 ENCOUNTER — Ambulatory Visit: Payer: Medicaid Other | Admitting: "Endocrinology

## 2016-03-05 ENCOUNTER — Ambulatory Visit (HOSPITAL_COMMUNITY)
Admission: RE | Admit: 2016-03-05 | Discharge: 2016-03-05 | Disposition: A | Discharge status: Home / Self Care | Payer: Medicaid Other | Source: Ambulatory Visit | Attending: "Endocrinology | Admitting: "Endocrinology

## 2016-03-05 DIAGNOSIS — E038 Other specified hypothyroidism: Secondary | ICD-10-CM | POA: Insufficient documentation

## 2016-03-17 ENCOUNTER — Encounter: Payer: Self-pay | Admitting: "Endocrinology

## 2016-03-17 ENCOUNTER — Ambulatory Visit (INDEPENDENT_AMBULATORY_CARE_PROVIDER_SITE_OTHER): Payer: Medicaid Other | Admitting: "Endocrinology

## 2016-03-17 VITALS — BP 124/81 | HR 63 | Ht 64.0 in | Wt 221.0 lb

## 2016-03-17 DIAGNOSIS — R7303 Prediabetes: Secondary | ICD-10-CM | POA: Diagnosis not present

## 2016-03-17 DIAGNOSIS — E063 Autoimmune thyroiditis: Secondary | ICD-10-CM

## 2016-03-17 DIAGNOSIS — E559 Vitamin D deficiency, unspecified: Secondary | ICD-10-CM

## 2016-03-17 DIAGNOSIS — E038 Other specified hypothyroidism: Secondary | ICD-10-CM | POA: Diagnosis not present

## 2016-03-17 DIAGNOSIS — E7800 Pure hypercholesterolemia, unspecified: Secondary | ICD-10-CM | POA: Diagnosis not present

## 2016-03-17 NOTE — Progress Notes (Signed)
Subjective:    Patient ID: Eileen BombardBonnie G Nurse, female    DOB: 06/17/1970, PCP Lucia EstelleFeng Zheng, NP  Past medical history: Hypothyroidism. Past surgical history: Cholecystectomy in 2008, tonsillectomy in 2003.  Social History   Social History  . Marital status: Widowed    Spouse name: N/A  . Number of children: N/A  . Years of education: N/A   Social History Main Topics  . Smoking status: Current Every Day Smoker    Packs/day: 1.00    Types: Cigarettes  . Smokeless tobacco: Never Used  . Alcohol use Yes     Comment: occasionally   . Drug use: No  . Sexual activity: Not Asked   Other Topics Concern  . None   Social History Narrative  . None   Outpatient Encounter Prescriptions as of 03/17/2016  Medication Sig  . atorvastatin (LIPITOR) 20 MG tablet Take 20 mg by mouth daily.  . Cholecalciferol (VITAMIN D-3) 1000 units CAPS Take 2 capsules by mouth daily.  Marland Kitchen. lamoTRIgine (LAMICTAL) 150 MG tablet Take 150 mg by mouth daily.  Marland Kitchen. levothyroxine (SYNTHROID, LEVOTHROID) 200 MCG tablet Take 200 mcg by mouth daily before breakfast.  . levothyroxine (SYNTHROID, LEVOTHROID) 50 MCG tablet Take 1 tablet (50 mcg total) by mouth daily before breakfast.  . magnesium gluconate (MAGONATE) 500 MG tablet Take 500 mg by mouth 2 (two) times daily.  . metFORMIN (GLUCOPHAGE) 500 MG tablet Take 1 tablet (500 mg total) by mouth daily after supper.  Marland Kitchen. omeprazole (PRILOSEC) 40 MG capsule Take 40 mg by mouth daily.   No facility-administered encounter medications on file as of 03/17/2016.    ALLERGIES: Allergies  Allergen Reactions  . Codeine Itching  . Penicillins     REACTION: "doesn't help"   VACCINATION STATUS:  There is no immunization history on file for this patient.  HPI 46 year old female patient with medical history as above. She is being seen in f/u for [place]-standing hypo-thyroidism requested by her PCP Lucia EstelleFeng Zheng, FNP. - She reports that she was diagnosed with hypothyroidism approximately  10 years ago. She required thyroid hormone replacement at various doses over the years. Currently she is taking levothyroxine 250 g by mouth every morning. She reports compliance in taking her thyroid hormone in the morning on empty stomach separated from antacids and PPIs. - She has no new complaints. - She denies family history of thyroid dysfunction/thyroid cancer. She has 4 grown children, did not have hypothyroidism during any of those pregnancies. - She denies heat/cold intolerance. She is a chronic heavy smoker. She complains of weight gain which is progressive.  Review of Systems Constitutional: + weight gain, no fatigue, no subjective hyperthermia/hypothermia Eyes: no blurry vision, no xerophthalmia ENT: no sore throat, no nodules palpated in throat, no dysphagia/odynophagia, no hoarseness Cardiovascular: no CP/SOB/palpitations/leg swelling Respiratory: + cough,  -SOB Gastrointestinal: no N/V/D/C Musculoskeletal: no muscle/joint aches Skin: She has mild hirsutism on upper extremities and mustache which is not a new problem for her. Neurological: no tremors/numbness/tingling/dizziness Psychiatric: no depression/anxiety  Objective:    BP 124/81   Pulse 63   Ht 5\' 4"  (1.626 m)   Wt 221 lb (100.2 kg)   BMI 37.93 kg/m   Wt Readings from Last 3 Encounters:  03/17/16 221 lb (100.2 kg)  11/28/15 217 lb (98.4 kg)  07/31/14 220 lb (99.8 kg)    Physical Exam Constitutional: overweight, in NAD Eyes: PERRLA, EOMI, no exophthalmos ENT:  Grade 2 hirsutism on mustache, and upper extremities,  moist mucous  membranes, mild  thyromegaly, no cervical lymphadenopathy Cardiovascular: Distant heart sounds, No MRG Respiratory: Tight chest with no wheezing. Gastrointestinal: abdomen soft, NT, ND, BS+ Musculoskeletal: no deformities, strength intact in all 4 Skin: moist, warm, no rashes Neurological: no tremor with outstretched hands, DTR normal in all 4  CMP     Component Value Date/Time    NA 140 01/28/2009 2011   K 4.7 01/28/2009 2011   CL 106 01/28/2009 2011   CO2 22 01/28/2009 2011   GLUCOSE 86 01/28/2009 2011   BUN 8 01/28/2009 2011   CREATININE 0.57 01/28/2009 2011   CALCIUM 8.8 01/28/2009 2011   PROT 6.6 01/28/2009 2011   ALBUMIN 4.4 01/28/2009 2011   AST 14 01/28/2009 2011   ALT 22 01/28/2009 2011   ALKPHOS 89 01/28/2009 2011   BILITOT 0.3 01/28/2009 2011    On 02/24/2016 labs show: Free T4 1.43, TSH 3.08, free T3 2.8, A1c 6.1%, vitamin D 38  thyroid function tests from 10/18/2015 showed free T4 above target at 1.54 (normal 0.7 6-1.46), TSH 4.36 (normal 0.3 5-3.74)  Her last 5 TSH measurements starting from March 2017 to September 2017 are as follows 6.5, 8.49, 0.81, 4.18, and 4.36.  Thyroid ultrasound from 03/05/2016: Unremarkable with no nodules lesions.  Assessment & Plan:   1. Hypothyroidism due to Hashimoto's thyroiditis.  - She is on exceptionally large dose of thyroid hormone currently at 250g of levothyroxine daily. - I repeat labs are consistent with appropriate replacement. Her labs also shows that Hashimoto's thyroiditis is the  Likely cause of her hypothyroidism. - Reason for this may be poor gastric absorption. - I discussed the rare possibility of thyroid hormone resistance, even though this looks unlikely at this time.   - We discussed about correct intake of levothyroxine, at fasting, with water, separated by at least 30 minutes from breakfast, and separated by more than 4 hours from calcium, iron, multivitamins, acid reflux medications (PPIs). -Patient is made aware of the fact that thyroid hormone replacement is needed for life, dose to be adjusted by periodic monitoring of thyroid function tests. Her next labs will be 3 months from now.  2. Prediabetes - She is at high risk for type 2 diabetes and her recent A1c is improving to 6.1%. I had a [place] discussion regarding 2 diabetes prevention/delay. I suggested for her to start caloric  restriction in hope of weight loss and continue low-dose metformin therapy 500 mg by mouth after supper. She agrees with this plan and I will prescribe.  3. Class 2 obesity due to excess calories with serious comorbidity - See #2.  4. Current smoker - I have counseled her to consider smoking cessation. 5. Clinical goiter - Unremarkable, no nodular findings.    - 25 minutes of time was spent on the care of this patient , 50% of which was applied for counseling on diabetes complications and their preventions.  - I advised patient to maintain close follow up with Lucia Estelle, NP for primary care needs. Follow up plan: Return in about 6 months (around 09/14/2016) for follow up with pre-visit labs.  Marquis Lunch, MD Phone: [phone]  Fax: [phone]   03/17/2016, 10:51 AM

## 2016-03-21 ENCOUNTER — Other Ambulatory Visit: Payer: Self-pay | Admitting: "Endocrinology

## 2016-04-20 ENCOUNTER — Other Ambulatory Visit: Payer: Self-pay | Admitting: "Endocrinology

## 2016-07-10 ENCOUNTER — Other Ambulatory Visit: Payer: Self-pay | Admitting: "Endocrinology

## 2016-09-15 ENCOUNTER — Ambulatory Visit: Payer: Medicaid Other | Admitting: "Endocrinology

## 2016-10-06 LAB — HEMOGLOBIN A1C: HEMOGLOBIN A1C: 6.1

## 2016-10-06 LAB — TSH: TSH: 0.38 — AB (ref <=5.90)

## 2016-10-13 ENCOUNTER — Encounter: Payer: Self-pay | Admitting: "Endocrinology

## 2016-10-13 ENCOUNTER — Ambulatory Visit (INDEPENDENT_AMBULATORY_CARE_PROVIDER_SITE_OTHER): Payer: Medicaid Other | Admitting: "Endocrinology

## 2016-10-13 VITALS — BP 137/84 | HR 68 | Ht 64.0 in | Wt 225.0 lb

## 2016-10-13 DIAGNOSIS — R7303 Prediabetes: Secondary | ICD-10-CM

## 2016-10-13 DIAGNOSIS — E063 Autoimmune thyroiditis: Secondary | ICD-10-CM | POA: Diagnosis not present

## 2016-10-13 DIAGNOSIS — E038 Other specified hypothyroidism: Secondary | ICD-10-CM | POA: Diagnosis not present

## 2016-10-13 DIAGNOSIS — E782 Mixed hyperlipidemia: Secondary | ICD-10-CM | POA: Diagnosis not present

## 2016-10-13 MED ORDER — LEVOTHYROXINE SODIUM 200 MCG PO TABS: ORAL_TABLET | ORAL | 6 refills | Status: DC

## 2016-10-13 MED ORDER — LEVOTHYROXINE SODIUM 25 MCG PO TABS: 25.0000 ug | ORAL_TABLET | Freq: Every day | ORAL | 6 refills | Status: DC

## 2016-10-13 NOTE — Progress Notes (Signed)
Subjective:    Patient ID: Eileen [name], female    DOB: 01-Dec-1970, PCP Lucia Estelle, NP  Past medical history: Hypothyroidism. Past surgical history: Cholecystectomy in 2008, tonsillectomy in 2003.  Social History   Social History  . Marital status: Widowed    Spouse name: N/A  . Number of children: N/A  . Years of education: N/A   Social History Main Topics  . Smoking status: Current Every Day Smoker    Packs/day: 1.00    Types: Cigarettes  . Smokeless tobacco: Never Used  . Alcohol use Yes     Comment: occasionally   . Drug use: No  . Sexual activity: Not Asked   Other Topics Concern  . None   Social History Narrative  . None   Outpatient Encounter Prescriptions as of 10/13/2016  Medication Sig  . atorvastatin (LIPITOR) 20 MG tablet Take 20 mg by mouth daily.  . Cholecalciferol (VITAMIN D-3) 1000 units CAPS Take 2 capsules by mouth daily.  Marland Kitchen lamoTRIgine (LAMICTAL) 150 MG tablet Take 150 mg by mouth daily.  Marland Kitchen levothyroxine (SYNTHROID) 200 MCG tablet TAKE (1) TABLET DAILY FOR THYROID.  Marland Kitchen levothyroxine (SYNTHROID) 25 MCG tablet Take 1 tablet (25 mcg total) by mouth daily before breakfast.  . magnesium gluconate (MAGONATE) 500 MG tablet Take 500 mg by mouth 2 (two) times daily.  . metFORMIN (GLUCOPHAGE) 500 MG tablet Take 1 tablet (500 mg total) by mouth daily after supper.  Marland Kitchen omeprazole (PRILOSEC) 40 MG capsule Take 40 mg by mouth daily.  . [DISCONTINUED] SYNTHROID 200 MCG tablet TAKE (1) TABLET DAILY FOR THYROID.  . [DISCONTINUED] SYNTHROID 50 MCG tablet TAKE 1 TABLET IN THE MORNING BEFORE BREAKFAST   No facility-administered encounter medications on file as of 10/13/2016.    ALLERGIES: Allergies  Allergen Reactions  . Codeine Itching  . Penicillins     REACTION: "doesn't help"   VACCINATION STATUS:  There is no immunization history on file for this patient.  HPI 46 year old female patient with medical history as above. She is being seen in f/u for  [place]-standing hypothyroidism requested by her PCP Lucia Estelle, FNP. - She reports that she was diagnosed with hypothyroidism approximately 10 years ago. She required thyroid hormone replacement at various doses over the years. Currently she is taking levothyroxine 250 g by mouth every morning. She reports compliance in taking her thyroid hormone in the morning on empty stomach separated from antacids and PPIs. - She has no new complaints. - She denies family history of thyroid dysfunction/thyroid cancer. She has 4 grown children, did not have hypothyroidism during any of those pregnancies. - She denies heat/cold intolerance. She is a chronic heavy smoker. She complains of weight gain which is progressive.  Review of Systems Constitutional: + weight gain, no fatigue, no subjective hyperthermia/hypothermia Eyes: no blurry vision, no xerophthalmia ENT: no sore throat, no nodules palpated in throat, no dysphagia/odynophagia, no hoarseness Cardiovascular: no CP/SOB/palpitations/leg swelling Respiratory: + cough,  -SOB Gastrointestinal: no N/V/D/C Musculoskeletal: no muscle/joint aches Skin: She has mild hirsutism on upper extremities and mustache which is not a new problem for her. Neurological: no tremors/numbness/tingling/dizziness Psychiatric: no depression/anxiety  Objective:    BP 137/84   Pulse 68   Ht  (1.626 m)   Wt 225 lb (102.1 kg)   BMI 38.62 kg/m   Wt Readings from Last 3 Encounters:  10/13/16 225 lb (102.1 kg)  03/17/16 221 lb (100.2 kg)  11/28/15 217 lb (98.4 kg)  Physical Exam Constitutional: overweight, in NAD Eyes: PERRLA, EOMI, no exophthalmos ENT:   moist mucous membranes, mild  thyromegaly, no cervical lymphadenopathy Cardiovascular: Distant heart sounds, No MRG Respiratory: Tight chest with no wheezing. Gastrointestinal: abdomen soft, NT, ND, BS+ Musculoskeletal: no deformities, strength intact in all 4 Skin: moist, warm, no rashes Neurological: no  tremor with outstretched hands, DTR normal in all 4  CMP     Component Value Date/Time   NA 140 01/28/2009 2011   K 4.7 01/28/2009 2011   CL 106 01/28/2009 2011   CO2 22 01/28/2009 2011   GLUCOSE 86 01/28/2009 2011   BUN 8 01/28/2009 2011   CREATININE 0.57 01/28/2009 2011   CALCIUM 8.8 01/28/2009 2011   PROT 6.6 01/28/2009 2011   ALBUMIN 4.4 01/28/2009 2011   AST 14 01/28/2009 2011   ALT 22 01/28/2009 2011   ALKPHOS 89 01/28/2009 2011   BILITOT 0.3 01/28/2009 2011     Thyroid ultrasound from 03/05/2016: Unremarkable with no nodules lesions.  Assessment & Plan:   1. Hypothyroidism due to Hashimoto's thyroiditis. - her repeat thyroid function tests are consistent with slight over replacement with thyroid hormone. - I will lower he levothyroxine to 225 g by mouth every morning. This is still at large dose of - Reason for this may be poor gastric absorption.  - We discussed about correct intake of levothyroxine, at fasting, with water, separated by at least 30 minutes from breakfast, and separated by more than 4 hours from calcium, iron, multivitamins, acid reflux medications (PPIs). -Patient is made aware of the fact that thyroid hormone replacement is needed for life, dose to be adjusted by periodic monitoring of thyroid function tests. Her next labs will be 3 months from now , tests should involve TSH and free T4.  2. Prediabetes - She is at high risk for type 2 diabetes and her recent A1c is improving to 6.1%. I had a Arya discussion regarding 2 diabetes prevention/delay.  Suggestion is made for her to avoid simple carbohydrates  from her diet including Cakes, Sweet Desserts, Ice Cream, Soda (diet and regular), Sweet Tea, Candies, Chips, Cookies, Store Bought Juices, Alcohol in Excess of  1-2 drinks a day, Artificial Sweeteners, and "Sugar-free" Products. This will help patient to have stable blood glucose profile and potentially avoid unintended weight gain.  - I advised her  to stay on low-dose metformin 500 mg by mouth once a day.  3. Class 2 obesity due to excess calories with serious comorbidity - See #2.  4. Current smoker - I have counseled her to consider smoking cessation.  5. Clinical goiter - Unremarkable, no nodular findings.   - I advised patient to maintain close follow up with Lucia Estelle, NP for primary care needs. Follow up plan: Return in about 4 months (around 02/12/2017) for follow up with pre-visit labs.  Marquis Lunch, MD Phone: [phone]  Fax: [phone]  This note was partially dictated with voice recognition software. Similar sounding words can be transcribed inadequately or may not  be corrected upon review.  10/13/2016, 3:59 PM

## 2016-11-12 ENCOUNTER — Other Ambulatory Visit: Payer: Self-pay | Admitting: "Endocrinology

## 2016-12-24 ENCOUNTER — Other Ambulatory Visit: Payer: Self-pay | Admitting: "Endocrinology

## 2020-07-25 ENCOUNTER — Other Ambulatory Visit (HOSPITAL_COMMUNITY): Payer: Self-pay | Admitting: Nurse Practitioner

## 2020-07-25 ENCOUNTER — Other Ambulatory Visit: Payer: Self-pay | Admitting: Nurse Practitioner

## 2020-07-25 DIAGNOSIS — Z720 Tobacco use: Secondary | ICD-10-CM

## 2020-08-20 ENCOUNTER — Other Ambulatory Visit: Payer: Self-pay

## 2020-08-20 ENCOUNTER — Ambulatory Visit (HOSPITAL_COMMUNITY)
Admission: RE | Admit: 2020-08-20 | Discharge: 2020-08-20 | Disposition: A | Discharge status: Home / Self Care | Payer: Medicaid Other | Source: Ambulatory Visit | Attending: Nurse Practitioner | Admitting: Nurse Practitioner

## 2020-08-20 DIAGNOSIS — Z720 Tobacco use: Secondary | ICD-10-CM | POA: Diagnosis present

## 2020-08-27 ENCOUNTER — Other Ambulatory Visit (HOSPITAL_COMMUNITY): Payer: Self-pay | Admitting: Nurse Practitioner

## 2020-08-27 ENCOUNTER — Other Ambulatory Visit: Payer: Self-pay | Admitting: Nurse Practitioner

## 2020-08-27 DIAGNOSIS — E041 Nontoxic single thyroid nodule: Secondary | ICD-10-CM

## 2020-09-05 ENCOUNTER — Ambulatory Visit (HOSPITAL_COMMUNITY)
Admission: RE | Admit: 2020-09-05 | Discharge: 2020-09-05 | Disposition: A | Discharge status: Home / Self Care | Payer: Medicaid Other | Source: Ambulatory Visit | Attending: Nurse Practitioner | Admitting: Nurse Practitioner

## 2020-09-05 ENCOUNTER — Other Ambulatory Visit: Payer: Self-pay

## 2020-09-05 DIAGNOSIS — E041 Nontoxic single thyroid nodule: Secondary | ICD-10-CM | POA: Diagnosis present

## 2021-04-08 ENCOUNTER — Other Ambulatory Visit (HOSPITAL_COMMUNITY): Payer: Self-pay | Admitting: Nurse Practitioner

## 2021-04-08 DIAGNOSIS — R509 Fever, unspecified: Secondary | ICD-10-CM

## 2021-04-08 DIAGNOSIS — R197 Diarrhea, unspecified: Secondary | ICD-10-CM

## 2021-04-08 DIAGNOSIS — D72829 Elevated white blood cell count, unspecified: Secondary | ICD-10-CM

## 2021-04-08 DIAGNOSIS — R109 Unspecified abdominal pain: Secondary | ICD-10-CM

## 2021-04-09 ENCOUNTER — Encounter (HOSPITAL_COMMUNITY): Payer: Self-pay | Admitting: Radiology

## 2021-04-10 ENCOUNTER — Other Ambulatory Visit: Payer: Self-pay

## 2021-04-10 ENCOUNTER — Ambulatory Visit (HOSPITAL_COMMUNITY)
Admission: RE | Admit: 2021-04-10 | Discharge: 2021-04-10 | Disposition: A | Discharge status: Home / Self Care | Payer: Medicaid Other | Source: Ambulatory Visit | Attending: Nurse Practitioner | Admitting: Nurse Practitioner

## 2021-04-10 DIAGNOSIS — R509 Fever, unspecified: Secondary | ICD-10-CM | POA: Diagnosis present

## 2021-04-10 DIAGNOSIS — D72829 Elevated white blood cell count, unspecified: Secondary | ICD-10-CM | POA: Insufficient documentation

## 2021-04-10 DIAGNOSIS — R197 Diarrhea, unspecified: Secondary | ICD-10-CM | POA: Insufficient documentation

## 2021-04-10 DIAGNOSIS — R109 Unspecified abdominal pain: Secondary | ICD-10-CM | POA: Insufficient documentation

## 2021-04-10 LAB — POCT I-STAT CREATININE: Creatinine, Ser: 0.8 mg/dL (ref 0.44–1.00)

## 2021-04-10 MED ORDER — IOHEXOL 300 MG/ML  SOLN
100.0000 mL | Freq: Once | INTRAMUSCULAR | Status: AC | PRN
  Administered 2021-04-10: 100 mL via INTRAVENOUS

## 2021-04-11 ENCOUNTER — Emergency Department (HOSPITAL_COMMUNITY)
Admission: EM | Admit: 2021-04-11 | Discharge: 2021-04-11 | Disposition: A | Discharge status: Home / Self Care | Payer: Medicaid Other | Attending: Emergency Medicine | Admitting: Emergency Medicine

## 2021-04-11 ENCOUNTER — Encounter (HOSPITAL_COMMUNITY): Payer: Self-pay | Admitting: *Deleted

## 2021-04-11 DIAGNOSIS — R197 Diarrhea, unspecified: Secondary | ICD-10-CM | POA: Insufficient documentation

## 2021-04-11 DIAGNOSIS — R1031 Right lower quadrant pain: Secondary | ICD-10-CM

## 2021-04-11 DIAGNOSIS — R509 Fever, unspecified: Secondary | ICD-10-CM | POA: Insufficient documentation

## 2021-04-11 DIAGNOSIS — R1084 Generalized abdominal pain: Secondary | ICD-10-CM | POA: Insufficient documentation

## 2021-04-11 DIAGNOSIS — D72829 Elevated white blood cell count, unspecified: Secondary | ICD-10-CM | POA: Insufficient documentation

## 2021-04-11 HISTORY — DX: Essential (primary) hypertension: I10

## 2021-04-11 HISTORY — DX: Bipolar disorder, unspecified: F31.9

## 2021-04-11 NOTE — ED Triage Notes (Signed)
States she had a CT here yesterday and was advised to come in for evaluation ?

## 2021-04-11 NOTE — Discharge Instructions (Signed)
Follow-up with your stomach specialist if not improving.  Return if problem ?

## 2021-04-11 NOTE — Consult Note (Addendum)
NO charge for Consult. Patient did not allow me to do a full physical exam.  ? ?[facility] Consult ? ?Reason for Consult: Dilated appendix on CT /diarrhea ?Referring Physician: Dr. Estell Harpin  ? ?Chief Complaint   ?Abdominal Pain ?  ? ? ?HPI: Eileen [name] is a 51 y.o. female with HTN, hypothyroidism who comes in with complaints of diarrhea and lower abdominal pain since the previous Monday, 10+ days ago. She says that she has had diarrhea and has been trying eat everything she can but has been having diarrhea to the point she lost 10 lbs. No reported fever or sick contacts. No vomiting reported. ? ?She had a CT scan with her PCP 3/16 @ 10am.  This demonstrated colon with fluid and gas consistent with diarrhea type illness and an appendix at 8 mm but no definitive signs of appendicitis. ? ?The patient says she is hurting on the right side and into her back.  ? ?Past Medical History:  ?Diagnosis Date  ? Bipolar 1 disorder (HCC)   ? Hypertension   ? Hypothyroidism   ? ? ?Past Surgical History:  ?Procedure Laterality Date  ? CHOLECYSTECTOMY    ? TONSILLECTOMY    ? ? ?History reviewed. No pertinent family history. ? ?Social History  ? ?Tobacco Use  ? Smoking status: Every Day  ?  Packs/day: 1.00  ?  Types: Cigarettes  ? Smokeless tobacco: Never  ?Substance Use Topics  ? Alcohol use: Not Currently  ?  Comment: occasionally   ? Drug use: Yes  ?  Types: Marijuana  ? ? ?Medications: I have reviewed the patient's current medications. ?No current facility-administered medications for this encounter.  ? ?Current Outpatient Medications  ?Medication Sig Dispense Refill Last Dose  ? atorvastatin (LIPITOR) 20 MG tablet Take 20 mg by mouth daily.     ? Cholecalciferol (VITAMIN D-3) 1000 units CAPS Take 2 capsules by mouth daily.     ? lamoTRIgine (LAMICTAL) 150 MG tablet Take 150 mg by mouth daily.     ? levothyroxine (SYNTHROID) 200 MCG tablet TAKE (1) TABLET DAILY FOR THYROID. 30 tablet 6   ? magnesium gluconate  (MAGONATE) 500 MG tablet Take 500 mg by mouth 2 (two) times daily.     ? metFORMIN (GLUCOPHAGE) 500 MG tablet TAKE 1 TABLET DAILY AFTER SUPPER 90 tablet 0   ? omeprazole (PRILOSEC) 40 MG capsule Take 40 mg by mouth daily.     ? SYNTHROID 25 MCG tablet TAKE 1 TABLET BY MOUTH ONCE DAILY BEFORE BREAKFAST 90 tablet 0   ? ? ?Allergies  ?Allergen Reactions  ? Codeine Itching  ? Penicillins   ?  REACTION: "doesn't help"  ? ? ? ?ROS:  ?A comprehensive review of systems was negative except for: Cardiovascular: positive for sharp pain in her chest at times? ?Gastrointestinal: positive for abdominal pain and diarrhea ? ?Blood pressure (!) 144/73, pulse 64, temperature 98.2 ?F (36.8 ?C), temperature source Oral, resp. rate 18, height 5\' 4"  (1.626 m), weight 97.5 kg, SpO2 99 %. ?Physical Exam ?Vitals reviewed.  ?Cardiovascular:  ?   Rate and Rhythm: Normal rate.  ?Pulmonary:  ?   Effort: Pulmonary effort is normal.  ?Neurological:  ?   General: No focal deficit present.  ?   Mental Status: She is alert.  ?Psychiatric:     ?   Mood and Affect: Mood normal.  ? ?Patient refused for me to do an abdominal exam.  ? ?Results: ?Results for orders placed or  performed during the hospital encounter of 04/10/21 (from the past 48 hour(s))  ?I-STAT creatinine     Status: None  ? Collection Time: 04/10/21  9:28 AM  ?Result Value Ref Range  ? Creatinine, Ser 0.80 0.44 - 1.00 mg/dL  ? ?Personally reviewed -appendix 8 mm but no significant inflammatory changes or hyperenhancement of the wall ?CT ABDOMEN PELVIS W CONTRAST ? ?Result Date: 04/10/2021 ?CLINICAL DATA:  Diarrhea, generalized abdominal pain, fever and leukocytosis. History of cholecystectomy clips. EXAM: CT ABDOMEN AND PELVIS WITH CONTRAST TECHNIQUE: Multidetector CT imaging of the abdomen and pelvis was performed using the standard protocol following bolus administration of intravenous contrast. RADIATION DOSE REDUCTION: This exam was performed according to the departmental  dose-optimization program which includes automated exposure control, adjustment of the mA and/or kV according to patient size and/or use of iterative reconstruction technique. CONTRAST:  OMNIPAQUE IOHEXOL 300 MG/ML  SOLN COMPARISON:  CT January 31, 2009 FINDINGS: Lower chest: No acute abnormality. Hepatobiliary: Wedge-shaped hypodensity in segment IV B along the falciform ligament is in a location commonly associated with focal fatty infiltration or altered perfusion. No suspicious hepatic lesion. Gallbladder surgically absent. No biliary ductal dilation. Pancreas: No pancreatic ductal dilation or evidence of acute inflammation. Spleen: No splenomegaly or focal splenic lesion. Adrenals/Urinary Tract: Bilateral adrenal glands appear normal. No hydronephrosis. Kidneys demonstrate symmetric enhancement and excretion of contrast material. No solid enhancing renal mass. Urinary bladder is unremarkable for degree of distension. Stomach/Bowel: Radiopaque enteric contrast material traverses the rectum. Stomach is minimally distended limiting evaluation. No pathologic dilation of small large bowel. Terminal ileum appears normal. The appendix is prominent measuring 8 mm with wall thickening without significant adjacent inflammatory stranding. Gas fluid levels throughout the colon suggestive of diarrheal illness. Vascular/Lymphatic: Aortic and branch vessel atherosclerosis without abdominal aortic aneurysm. No pathologically enlarged mediastinal, hilar or axillary lymph nodes. Reproductive: Uterus and bilateral adnexa are unremarkable. Other: No significant abdominopelvic free fluid. Musculoskeletal: No acute osseous abnormality. IMPRESSION: 1. The appendix is prominent measuring 8 mm with wall thickening without significant adjacent inflammatory stranding. Findings are equivocal for early acute appendicitis. 2. Gas fluid levels throughout the colon suggestive of diarrheal illness. 3.  Aortic Atherosclerosis  (ICD10-I70.0). Electronically Signed   By: Maudry Mayhew M.D.   On: 04/10/2021 10:33   ? ? ?Assessment & Plan:  ?Eileen [name] is a 51 y.o. female with a dilated appendix in the setting of a recent diarrheal illness colitis/ enteritis type picture. She has had pain and diarrhea for 10 days. The appendix does not look like appendicitis that has been setting in for 10 days. Discussed this with the patient. Discussed option of going home and seeing if she gets better and returning to the ED if worse versus admission overnight, monitoring and seeing how she feels in the AM with the potential for surgery labs or pain are worse. ? ?Patient frustrated because she has felt so poorly for the last week. Discussed that rushing into surgery for something she probably does not need is not in her best interest and reiterated option of staying overnight.  ? ?Discussed that surgery has risk and at this time I do not think the risk of surgery outweigh the benefit when we cannot be sure her appendix is the issues and not just the diarrheal illness  ? ?All questions were answered to the satisfaction of the patient. ? ?Since I was unable to do a thorough physical exam, I am not charging for this consult.  Updated  Dr. Estell HarpinZammit.  ? ? ? ?Eileen [name] ?04/11/2021, 5:35 PM  ? ? ? ? ? ?

## 2021-04-11 NOTE — ED Provider Notes (Signed)
? EMERGENCY DEPARTMENT ?Provider Note ? ? ?CSN: [id number] ?Arrival date & time: 04/11/21  1321 ? ?  ? ?History ? ?Chief Complaint  ?Patient presents with  ? Abdominal Pain  ? ? ?Eileen [name] is a 51 y.o. female. ? ?Patient complains of lower abdominal pain for almost 2 weeks with some diarrhea.  She had a CT scan yesterday that shows possible appendicitis.  History of hyperlipidemia ? ?The history is provided by the patient and medical records. No language interpreter was used.  ?Abdominal Pain ?Pain location:  Generalized ?Pain quality: aching   ?Pain severity:  Mild ?Onset quality:  Sudden ?Timing:  Constant ?Progression:  Waxing and waning ?Chronicity:  New ?Context: not alcohol use   ?Relieved by:  Nothing ?Worsened by:  Nothing ?Associated symptoms: diarrhea   ?Associated symptoms: no chest pain, no cough, no fatigue and no hematuria   ? ?  ? ?Home Medications ?Prior to Admission medications   ?Medication Sig Start Date End Date Taking? Authorizing Provider  ?atorvastatin (LIPITOR) 20 MG tablet Take 20 mg by mouth daily.    [provider]  ?Cholecalciferol (VITAMIN D-3) 1000 units CAPS Take 2 capsules by mouth daily.    [provider]  ?lamoTRIgine (LAMICTAL) 150 MG tablet Take 150 mg by mouth daily.    [provider]  ?levothyroxine (SYNTHROID) 200 MCG tablet TAKE (1) TABLET DAILY FOR THYROID. 10/13/16   Roma Kayser, MD  ?magnesium gluconate (MAGONATE) 500 MG tablet Take 500 mg by mouth 2 (two) times daily.    [provider]  ?metFORMIN (GLUCOPHAGE) 500 MG tablet TAKE 1 TABLET DAILY AFTER SUPPER 12/24/16   Roma Kayser, MD  ?omeprazole (PRILOSEC) 40 MG capsule Take 40 mg by mouth daily.    [provider]  ?SYNTHROID 25 MCG tablet TAKE 1 TABLET BY MOUTH ONCE DAILY BEFORE BREAKFAST 12/24/16   Roma Kayser, MD  ?   ? ?Allergies    ?Codeine and Penicillins   ? ?Review of Systems   ?Review of Systems  ?Constitutional:  Negative  for appetite change and fatigue.  ?HENT:  Negative for congestion, ear discharge and sinus pressure.   ?Eyes:  Negative for discharge.  ?Respiratory:  Negative for cough.   ?Cardiovascular:  Negative for chest pain.  ?Gastrointestinal:  Positive for abdominal pain and diarrhea.  ?Genitourinary:  Negative for frequency and hematuria.  ?Musculoskeletal:  Negative for back pain.  ?Skin:  Negative for rash.  ?Neurological:  Negative for seizures and headaches.  ?Psychiatric/Behavioral:  Negative for hallucinations.   ? ?Physical Exam ?Updated Vital Signs ?BP 118/73 (BP Location: Right Arm)   Pulse 67   Temp 98.2 ?F (36.8 ?C) (Oral)   Resp 17   Ht 5\' 4"  (1.626 m)   Wt 97.5 kg   SpO2 98%   BMI 36.90 kg/m?  ?Physical Exam ?Vitals and nursing note reviewed.  ?Constitutional:   ?   Appearance: She is well-developed.  ?HENT:  ?   Head: Normocephalic.  ?   Mouth/Throat:  ?   Mouth: Mucous membranes are moist.  ?Eyes:  ?   General: No scleral icterus. ?   Conjunctiva/sclera: Conjunctivae normal.  ?Neck:  ?   Thyroid: No thyromegaly.  ?Cardiovascular:  ?   Rate and Rhythm: Normal rate and regular rhythm.  ?   Heart sounds: No murmur heard. ?  No friction rub. No gallop.  ?Pulmonary:  ?   Breath sounds: No stridor. No wheezing or rales.  ?Chest:  ?  Chest wall: No tenderness.  ?Abdominal:  ?   General: There is no distension.  ?   Tenderness: There is abdominal tenderness. There is no rebound.  ?Musculoskeletal:     ?   General: Normal range of motion.  ?   Cervical back: Neck supple.  ?Lymphadenopathy:  ?   Cervical: No cervical adenopathy.  ?Skin: ?   Findings: No erythema or rash.  ?Neurological:  ?   Mental Status: She is alert and oriented to person, place, and time.  ?   Motor: No abnormal muscle tone.  ?   Coordination: Coordination normal.  ?Psychiatric:     ?   Behavior: Behavior normal.  ? ? ?ED Results / Procedures / Treatments   ?Labs ?(all labs ordered are listed, but only abnormal results are  displayed) ?Labs Reviewed - No data to display ? ?EKG ?None ? ?Radiology ?CT ABDOMEN PELVIS W CONTRAST ? ?Result Date: 04/10/2021 ?CLINICAL DATA:  Diarrhea, generalized abdominal pain, fever and leukocytosis. History of cholecystectomy clips. EXAM: CT ABDOMEN AND PELVIS WITH CONTRAST TECHNIQUE: Multidetector CT imaging of the abdomen and pelvis was performed using the standard protocol following bolus administration of intravenous contrast. RADIATION DOSE REDUCTION: This exam was performed according to the departmental dose-optimization program which includes automated exposure control, adjustment of the mA and/or kV according to patient size and/or use of iterative reconstruction technique. CONTRAST:  OMNIPAQUE IOHEXOL 300 MG/ML  SOLN COMPARISON:  CT January 31, 2009 FINDINGS: Lower chest: No acute abnormality. Hepatobiliary: Wedge-shaped hypodensity in segment IV B along the falciform ligament is in a location commonly associated with focal fatty infiltration or altered perfusion. No suspicious hepatic lesion. Gallbladder surgically absent. No biliary ductal dilation. Pancreas: No pancreatic ductal dilation or evidence of acute inflammation. Spleen: No splenomegaly or focal splenic lesion. Adrenals/Urinary Tract: Bilateral adrenal glands appear normal. No hydronephrosis. Kidneys demonstrate symmetric enhancement and excretion of contrast material. No solid enhancing renal mass. Urinary bladder is unremarkable for degree of distension. Stomach/Bowel: Radiopaque enteric contrast material traverses the rectum. Stomach is minimally distended limiting evaluation. No pathologic dilation of small large bowel. Terminal ileum appears normal. The appendix is prominent measuring 8 mm with wall thickening without significant adjacent inflammatory stranding. Gas fluid levels throughout the colon suggestive of diarrheal illness. Vascular/Lymphatic: Aortic and branch vessel atherosclerosis without abdominal aortic aneurysm.  No pathologically enlarged mediastinal, hilar or axillary lymph nodes. Reproductive: Uterus and bilateral adnexa are unremarkable. Other: No significant abdominopelvic free fluid. Musculoskeletal: No acute osseous abnormality. IMPRESSION: 1. The appendix is prominent measuring 8 mm with wall thickening without significant adjacent inflammatory stranding. Findings are equivocal for early acute appendicitis. 2. Gas fluid levels throughout the colon suggestive of diarrheal illness. 3.  Aortic Atherosclerosis (ICD10-I70.0). Electronically Signed   By: Maudry Mayhew M.D.   On: 04/10/2021 10:33   ? ?Procedures ?Procedures  ? ? ?Medications Ordered in ED ?Medications - No data to display ? ?ED Course/ Medical Decision Making/ A&P ?  ?                        ?Medical Decision Making ?This patient presents to the ED for concern of abdominal pain and diarrhea, this involves an extensive number of treatment options, and is a complaint that carries with it a high risk of complications and morbidity.  The differential diagnosis includes appendicitis, gastroenteritis ? ? ?Co morbidities that complicate the patient evaluation ? ?Hyperlipidemia ? ? ?Additional history obtained: ? ?Additional history obtained  from patient ?External records from outside source obtained and reviewed including hospital records ? ? ?Lab Tests: ?No labs ordered ? ?Imaging Studies ordered: ? ?I ordered imaging studies including CT scan of the abdomen ?I independently visualized and interpreted imaging which showed possible appendicitis ?I agree with the radiologist interpretation ? ? ?Cardiac Monitoring: / EKG: ? ?The patient was maintained on a cardiac monitor.  I personally viewed and interpreted the cardiac monitored which showed an underlying rhythm of: Normal sinus rhythm ? ? ?Consultations Obtained: ? ?I requested consultation with the general surgery,  and discussed lab and imaging findings as well as pertinent plan - they recommend: Felt like  patient did not have appendicitis.  She was considering admitting the patient for observation but the patient wanted to be discharged home ? ? ?Problem List / ED Course / Critical interventions / Medication management ? ?Ok EdwardsAbdo

## 2021-08-12 ENCOUNTER — Other Ambulatory Visit (HOSPITAL_COMMUNITY): Payer: Self-pay | Admitting: Nurse Practitioner

## 2021-08-12 ENCOUNTER — Other Ambulatory Visit: Payer: Self-pay | Admitting: Nurse Practitioner

## 2021-08-12 DIAGNOSIS — Z720 Tobacco use: Secondary | ICD-10-CM

## 2021-12-27 IMAGING — CT CT CHEST LUNG CANCER SCREENING LOW DOSE W/O CM
2 of 4 series · 15 of 36 positions shown, 18 images · non-contrast
Comparison: None.

CLINICAL DATA: Current smoker, 30 pack-year history.

EXAM:
CT CHEST WITHOUT CONTRAST LOW-DOSE FOR LUNG CANCER SCREENING
TECHNIQUE: Multidetector CT imaging of the chest was performed following the
standard protocol without IV contrast.

[Series 4: lungs · axial · 0.63mm/px · z∈[+1317,+1583]mm · 12 of 294 slices shown, 15 images]
[im 14/294  mediastinal]
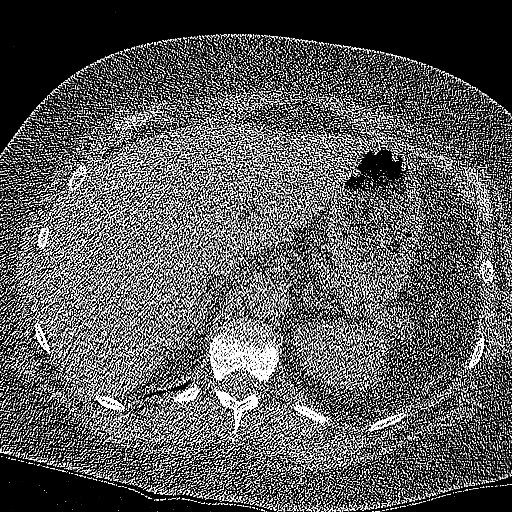
[im 14/294  lung]
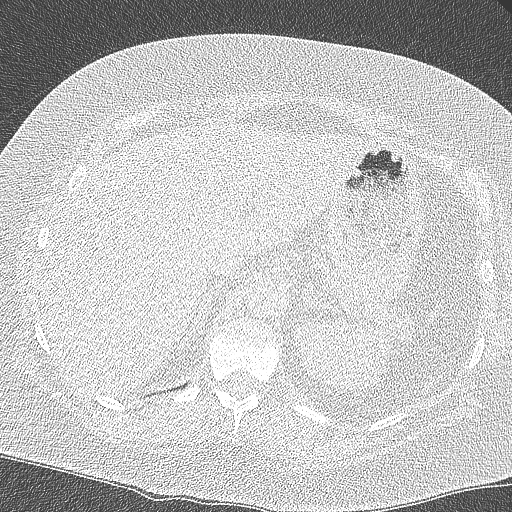
[im 54/294  lung]
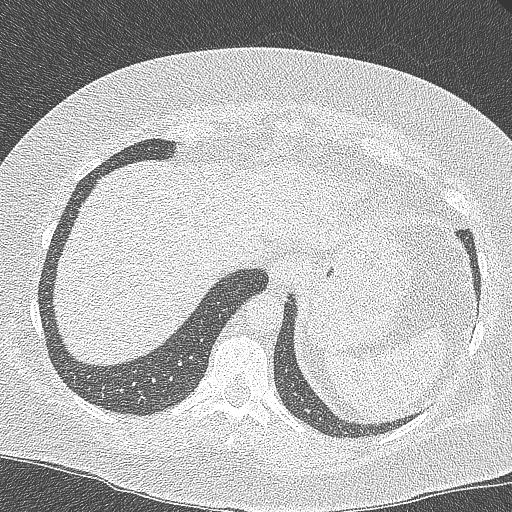
[im 80/294  lung]
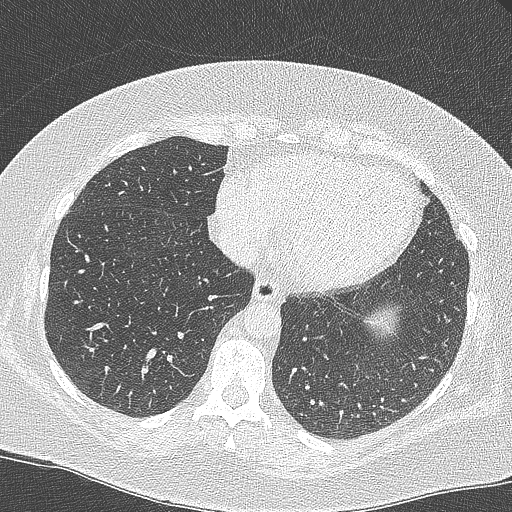
[im 94/294  lung]
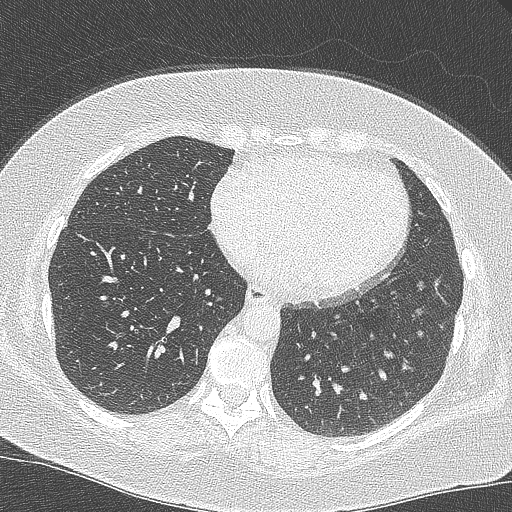
[im 120/294  mediastinal]
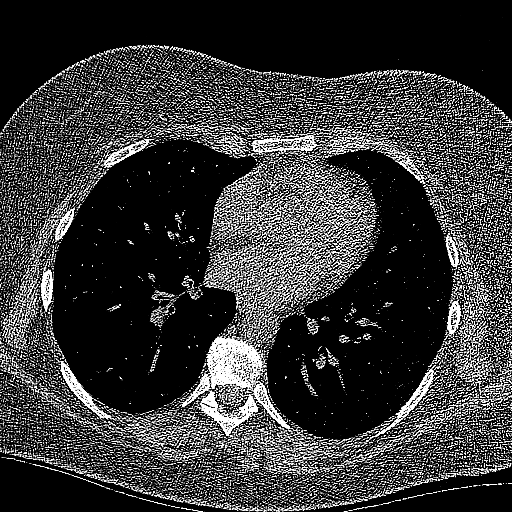
[im 120/294  lung]
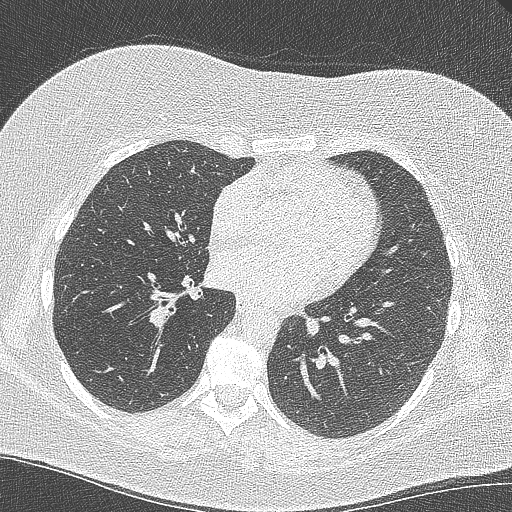
[im 147/294  lung]
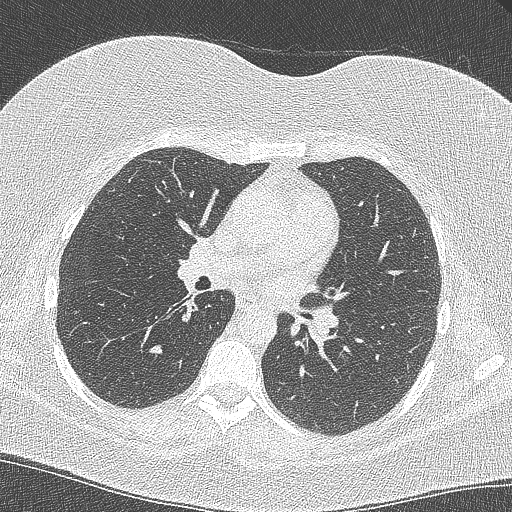
[im 160/294  lung]
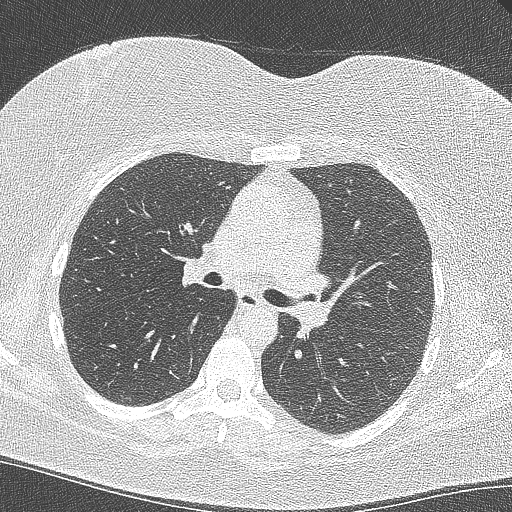
[im 187/294  lung]
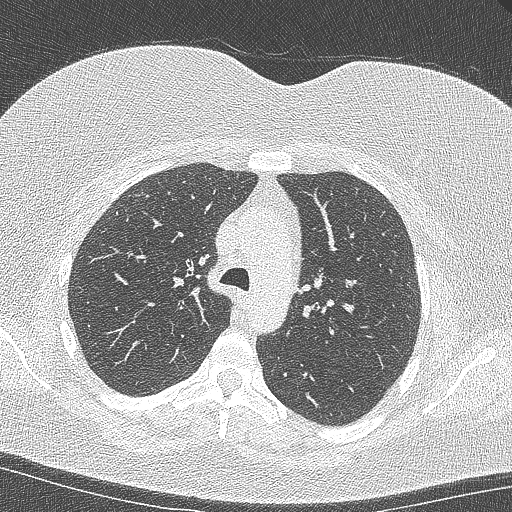
[im 214/294  mediastinal]
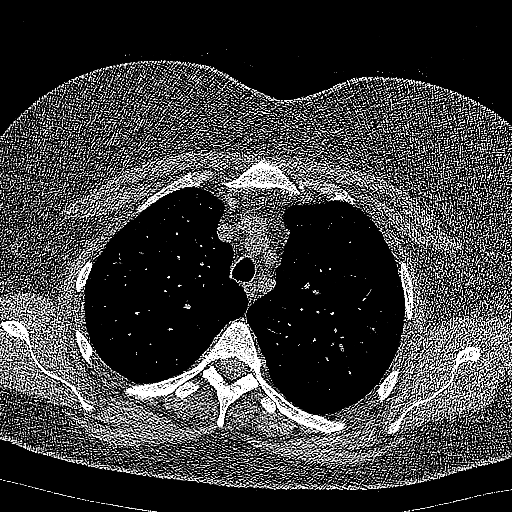
[im 214/294  lung]
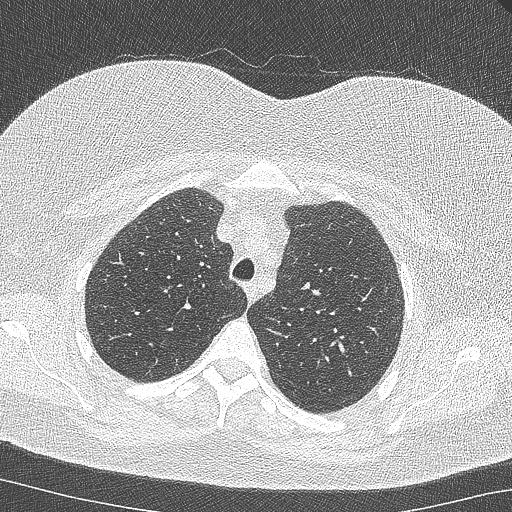
[im 227/294  lung]
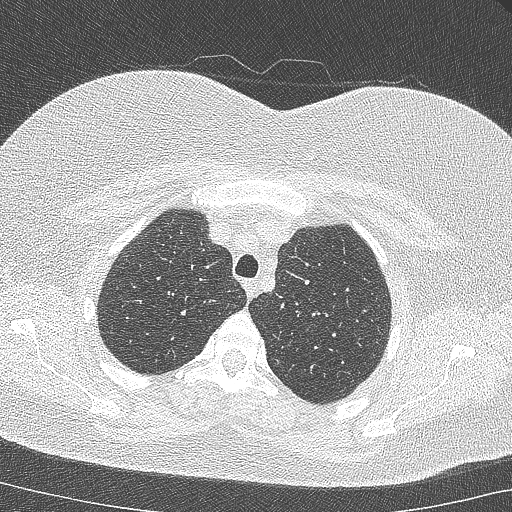
[im 254/294  lung]
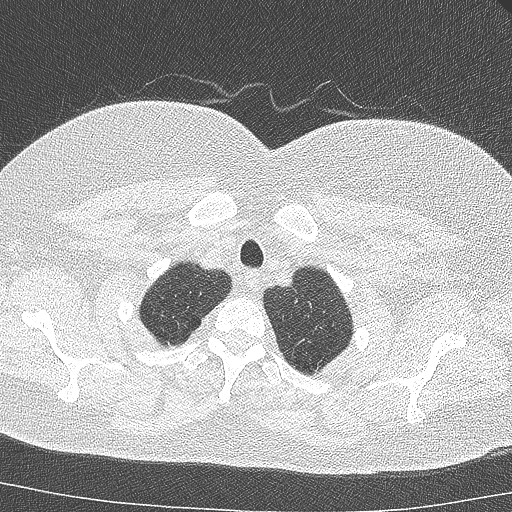
[im 280/294  lung]
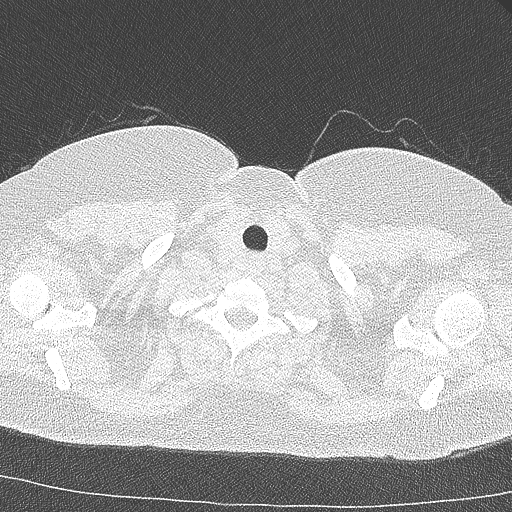

[Series 5: coronal · coronal · 0.61mm/px · 3 of 299 slices shown]
[im 60/299  lung]
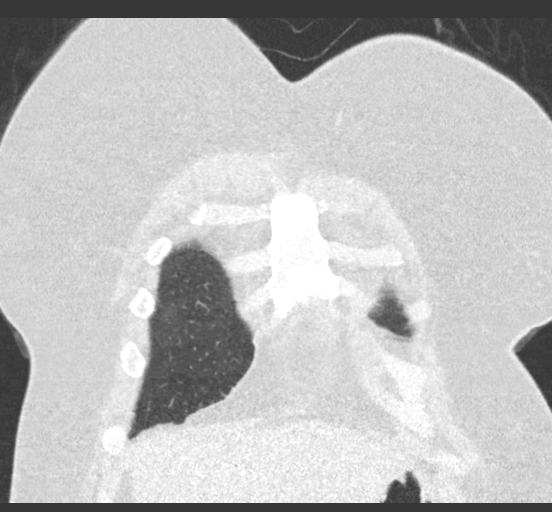
[im 120/299  lung]
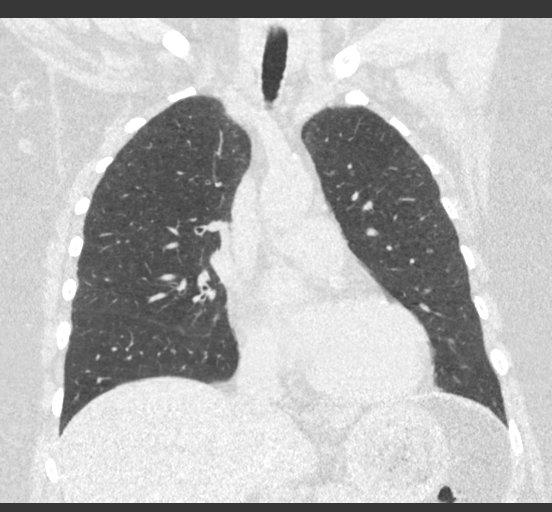
[im 179/299  lung]
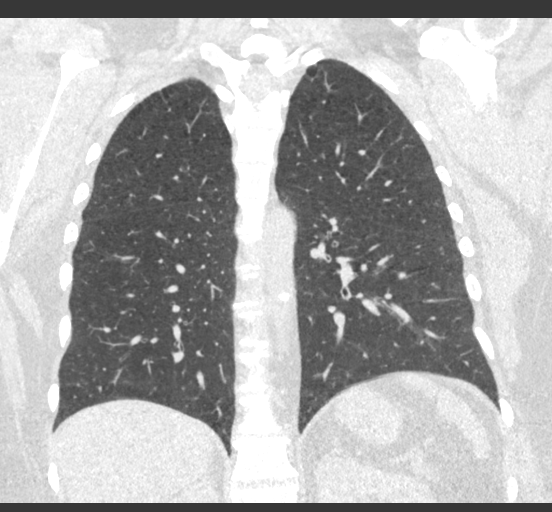

[15 of 36 positions shown; findings below may reference images not displayed]

FINDINGS: Cardiovascular: Atherosclerotic calcification of the aorta and
coronary arteries. Heart is at the upper limits of normal in size.
No pericardial effusion.

Mediastinum/Nodes: There may be a 1.9 cm low-attenuation lesion off
the posterior right thyroid. No pathologically enlarged mediastinal
or axillary lymph nodes. Hilar regions are difficult to evaluate
without IV contrast but appear grossly unremarkable. Esophagus is
grossly unremarkable.

Lungs/Pleura: Centrilobular and paraseptal emphysema. Smoking
related respiratory bronchiolitis and peribronchial thickening. No
suspicious pulmonary nodules. No pleural fluid. Airway is
unremarkable.

Upper Abdomen: Visualized portions of the liver, adrenal glands,
kidneys, spleen, pancreas and stomach are grossly unremarkable.

Musculoskeletal: Degenerative changes in the spine. No worrisome
lytic or sclerotic lesions.
IMPRESSION: 1. Lung-RADS 1, negative. Continue annual screening with low-dose
chest CT without contrast in 12 months.
2. Possible 1.9 cm low-attenuation lesion off the posterior right
thyroid. Recommend thyroid ultrasound. (Ref: [HOSPITAL]. [DATE]): 143-50).
3. Aortic atherosclerosis (FRYPT-V4J.J). Coronary artery
calcification.
4.  Emphysema ([id number]-2H1.G).

## 2022-01-12 IMAGING — US US THYROID
1 series · 14 of 25 positions shown · non-contrast
Comparison: None.

CLINICAL DATA: 50-year-old female with thyroid nodules

EXAM:
THYROID ULTRASOUND
TECHNIQUE: Ultrasound examination of the thyroid gland and adjacent soft
tissues was performed.

[Series 1: us thyroid · 14 of 45 slices shown]
[im 1/45]
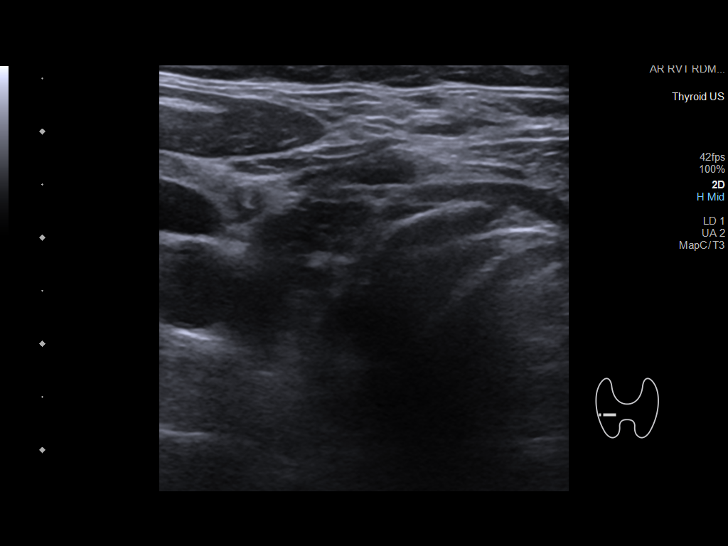
[im 4/45]
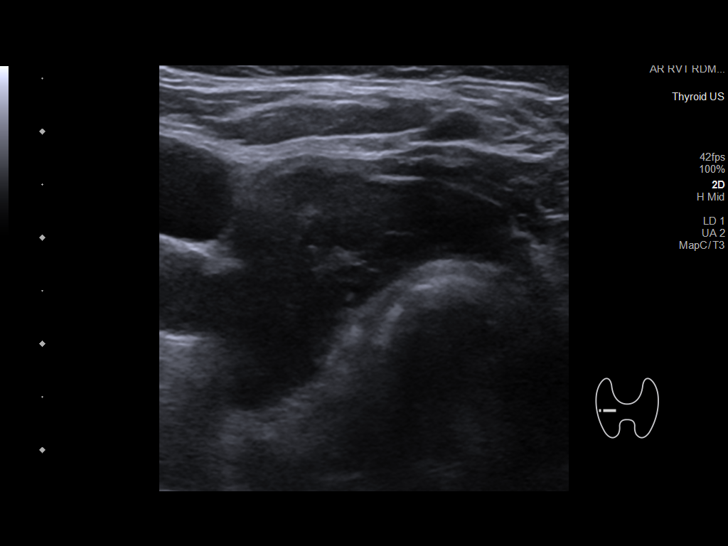
[im 8/45]
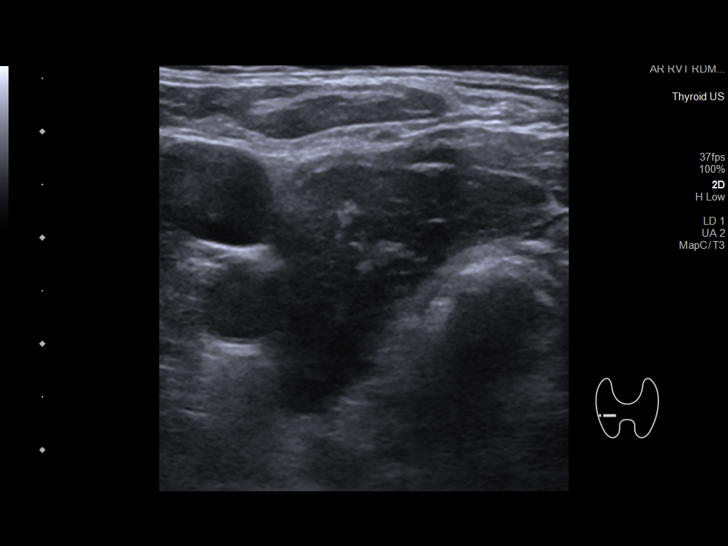
[im 12/45]
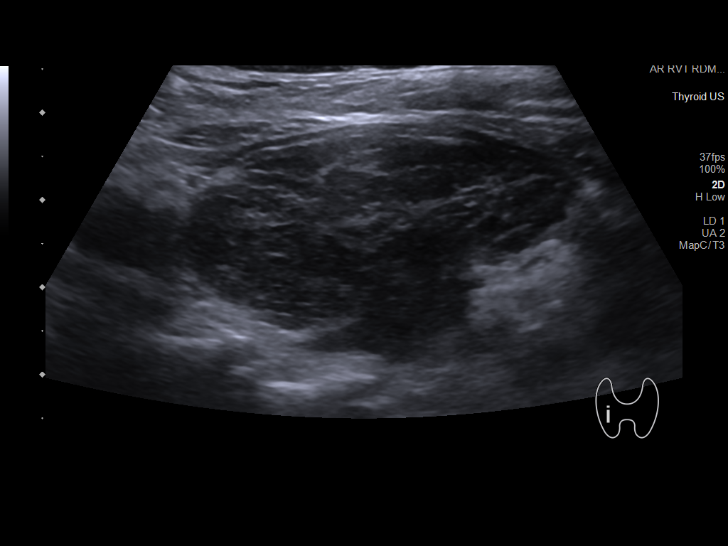
[im 15/45]
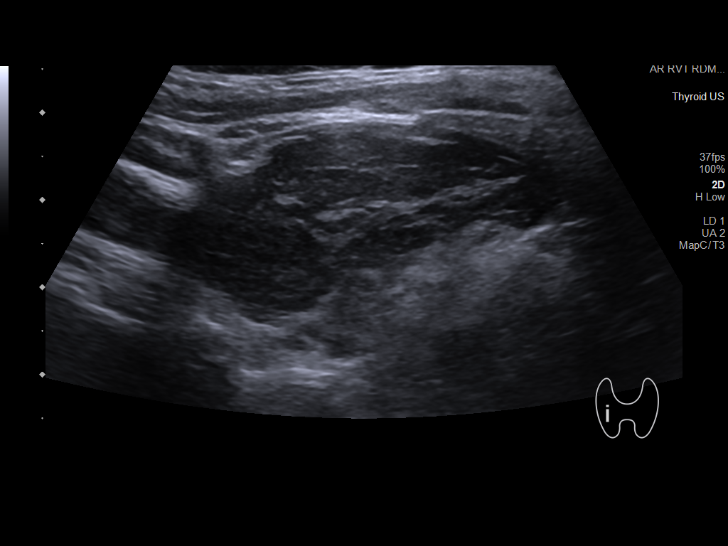
[im 17/45]
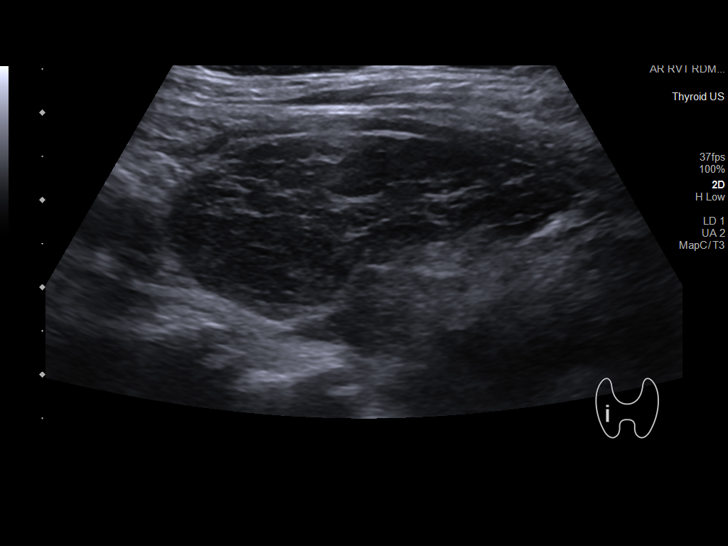
[im 21/45]
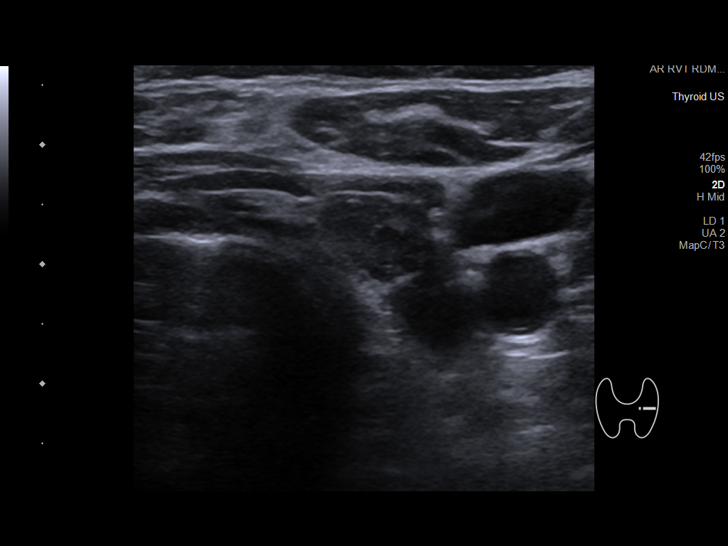
[im 24/45]
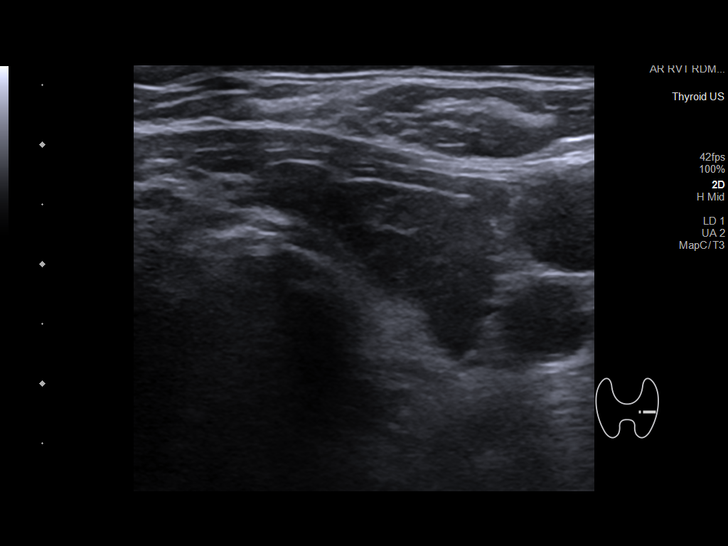
[im 28/45]
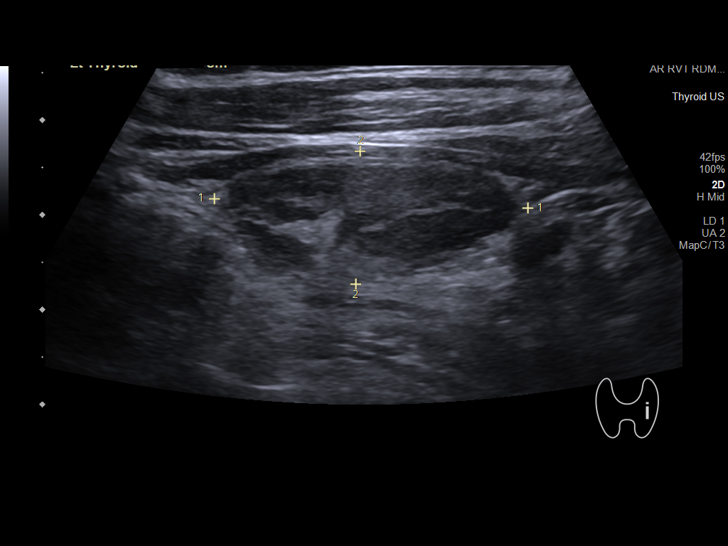
[im 30/45]
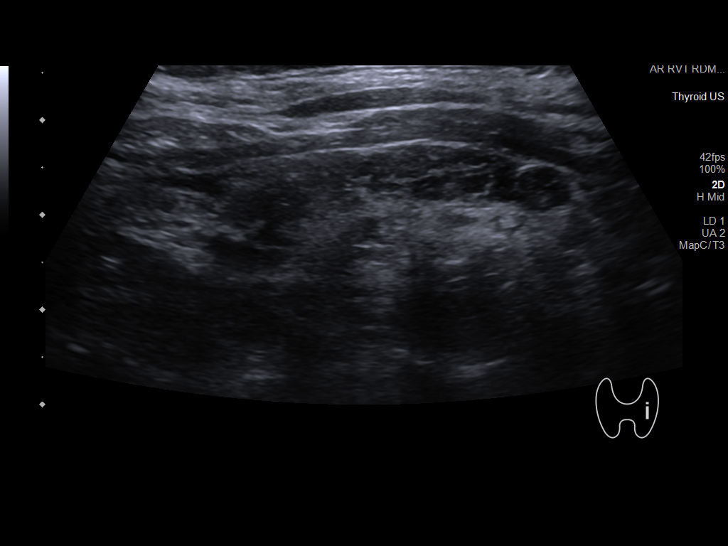
[im 34/45]
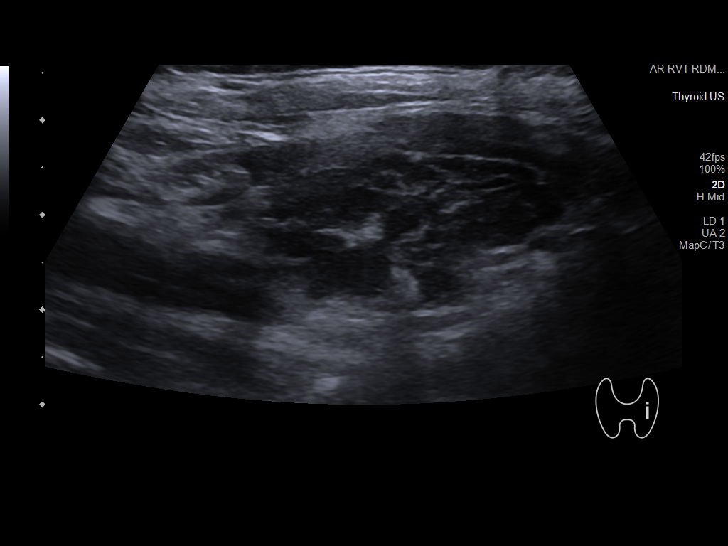
[im 37/45]
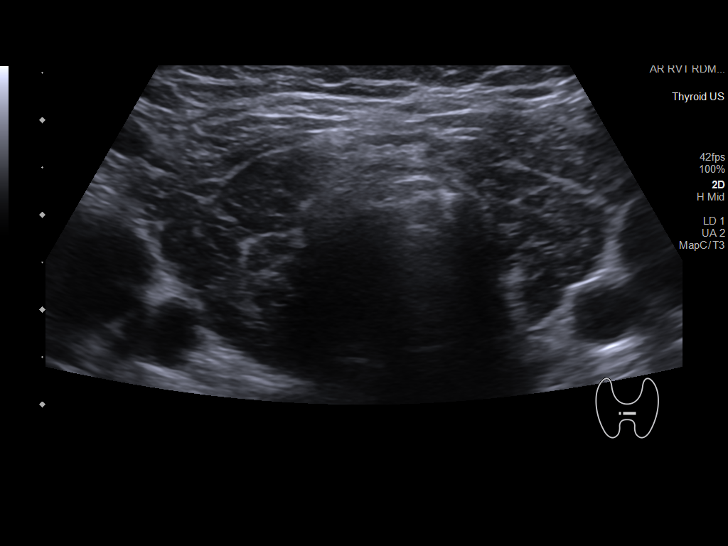
[im 41/45]
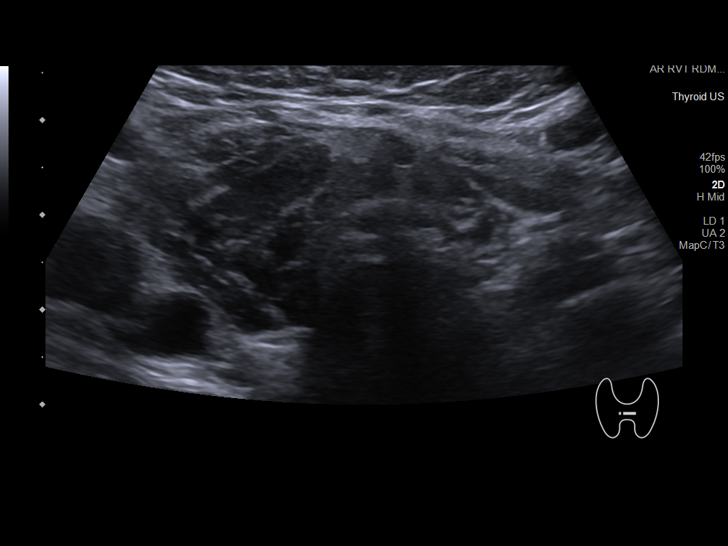
[im 45/45]
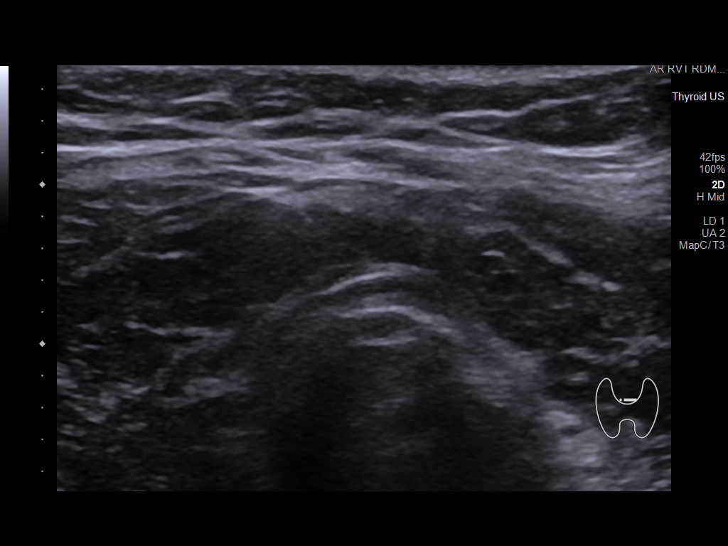

[14 of 25 positions shown; findings below may reference images not displayed]

FINDINGS: Parenchymal Echotexture: Markedly heterogenous

Isthmus: 0.3 cm

Right lobe: 4.7 cm x 2.9 cm x 1.5 cm

Left lobe: 3.3 cm x 1.4 cm x 1.3 cm

_________________________________________________________

Estimated total number of nodules >/= 1 cm: 0

Number of spongiform nodules >/=  2 cm not described below (TR1): 0

Number of mixed cystic and solid nodules >/= 1.5 cm not described
below (TR2): 0

_________________________________________________________

No adenopathy

No thyroid nodules identified.
IMPRESSION: Heterogeneous thyroid suggesting medical thyroid disease.

## 2022-08-17 IMAGING — CT CT ABD-PELV W/ CM
3 of 5 series · 16 of 46 positions shown, 18 images · IV contrast (Omnipaque or Isovue)
Comparison: CT January 31, 2009

CLINICAL DATA: Diarrhea, generalized abdominal pain, fever and
leukocytosis. History of cholecystectomy clips.

EXAM:
CT ABDOMEN AND PELVIS WITH CONTRAST
TECHNIQUE: Multidetector CT imaging of the abdomen and pelvis was performed
using the standard protocol following bolus administration of
intravenous contrast.

[Series 2: axial st · axial · 0.84mm/px · z∈[+872,+1272]mm · 11 of 97 slices shown, 13 images]
[im 9/97  soft-tissue]
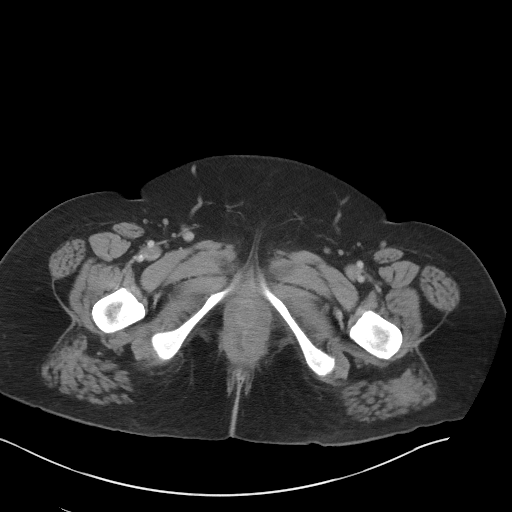
[im 9/97  bone]
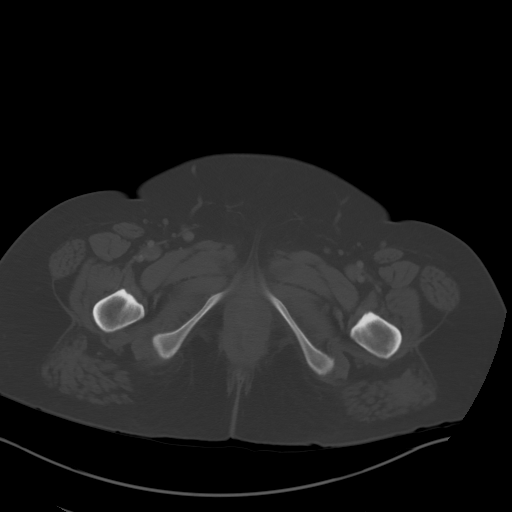
[im 17/97  soft-tissue]
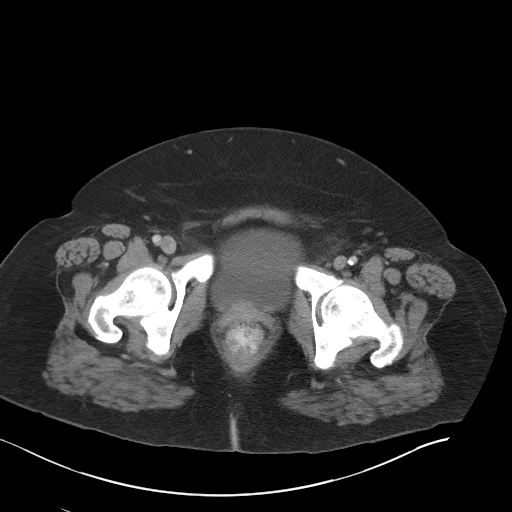
[im 25/97  soft-tissue]
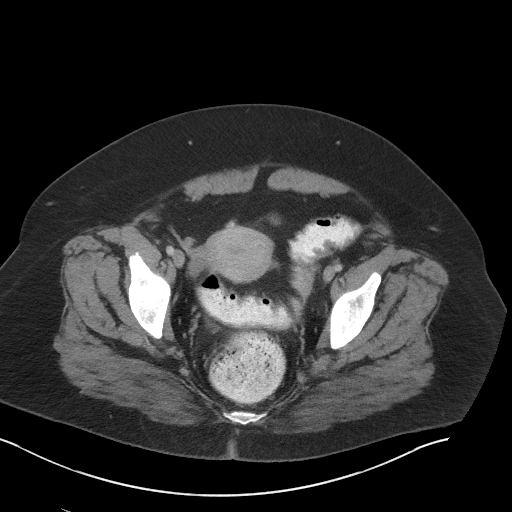
[im 33/97  soft-tissue]
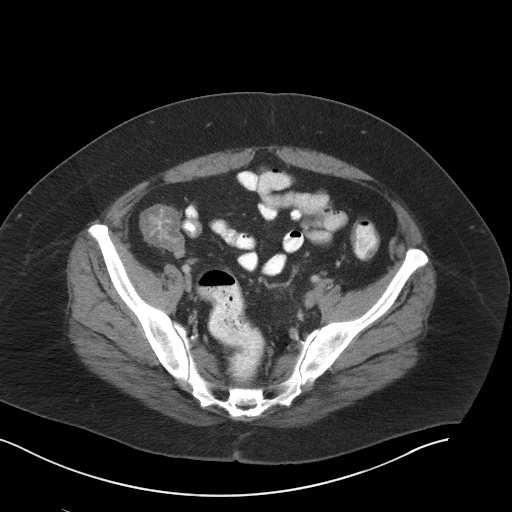
[im 41/97  soft-tissue]
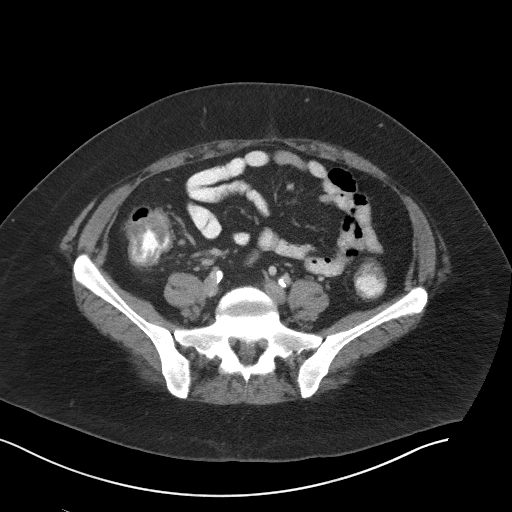
[im 49/97  soft-tissue]
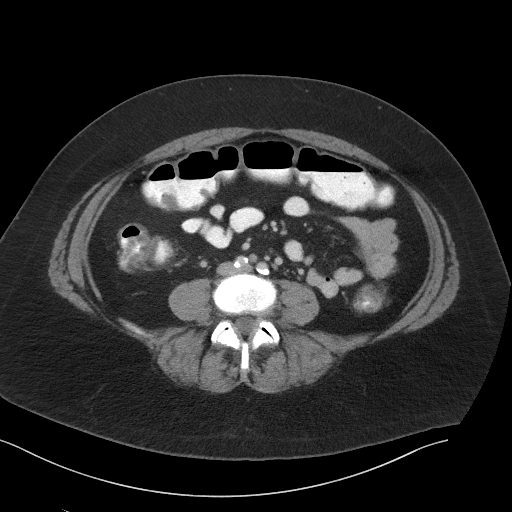
[im 57/97  soft-tissue]
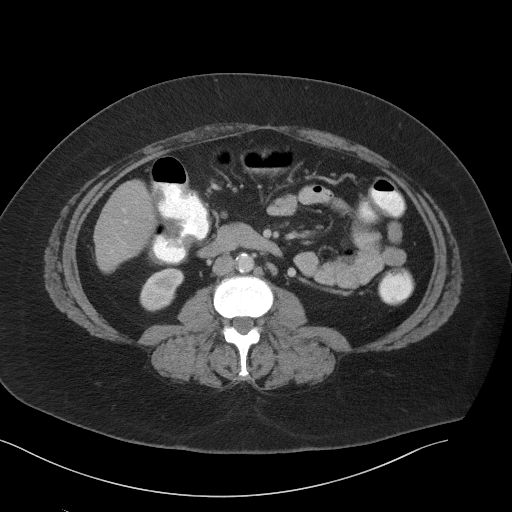
[im 65/97  soft-tissue]
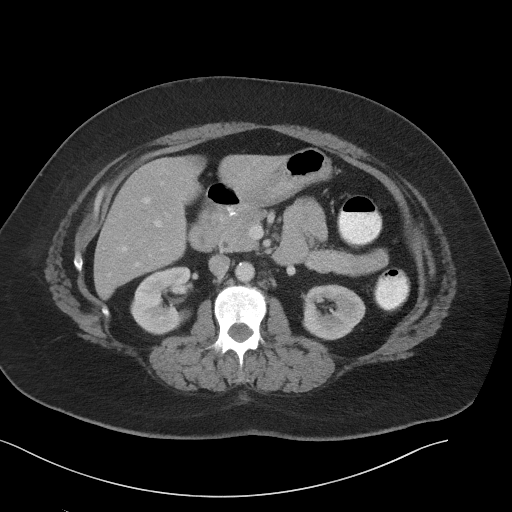
[im 73/97  soft-tissue]
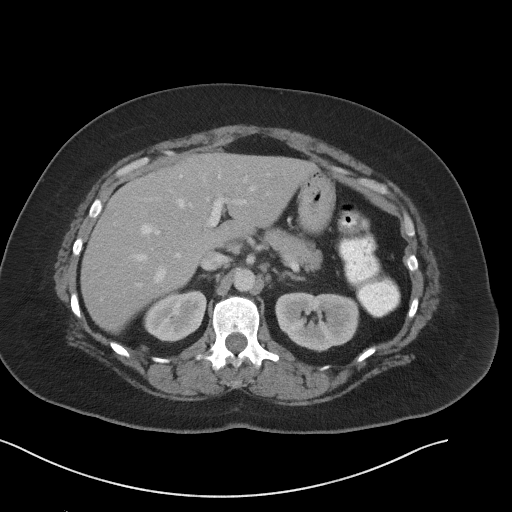
[im 73/97  bone]
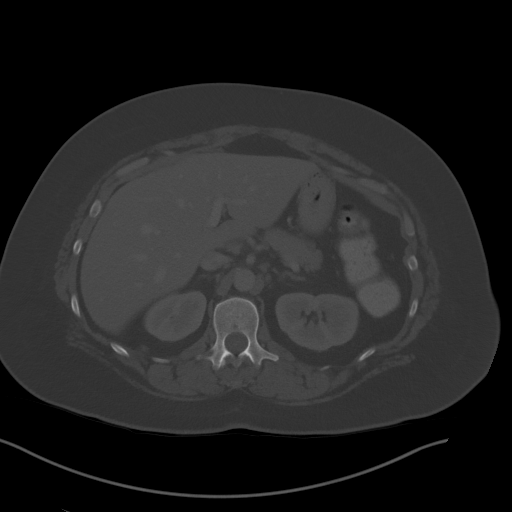
[im 81/97  soft-tissue]
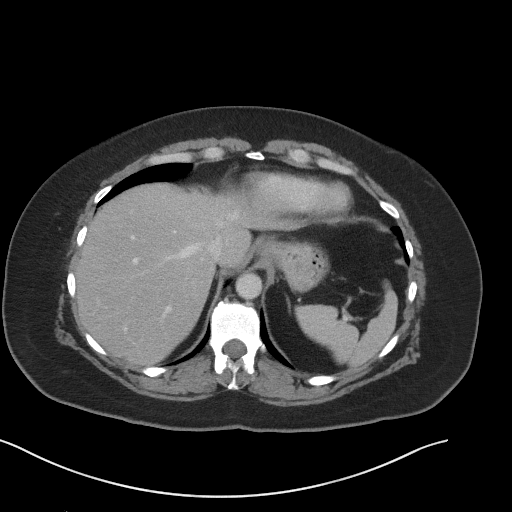
[im 89/97  soft-tissue]
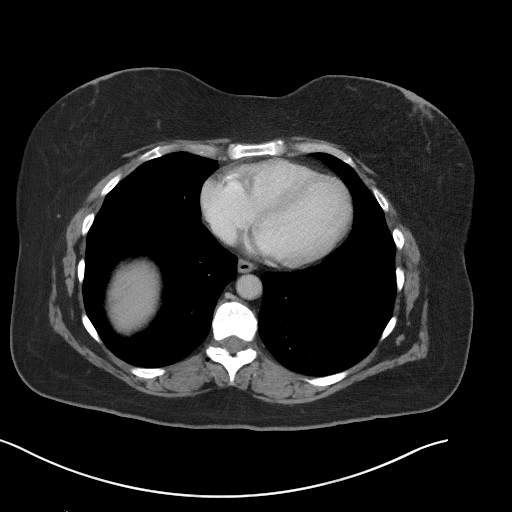

[Series 5: lung bases · axial · 0.84mm/px · z∈[+867,+942]mm · 2 of 97 slices shown]
[im 8/97  bone]
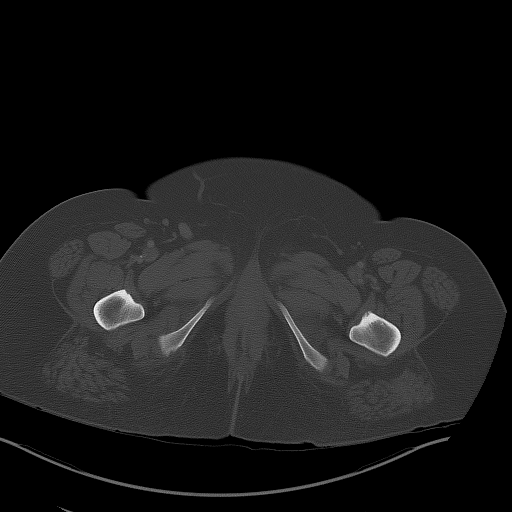
[im 23/97  bone]
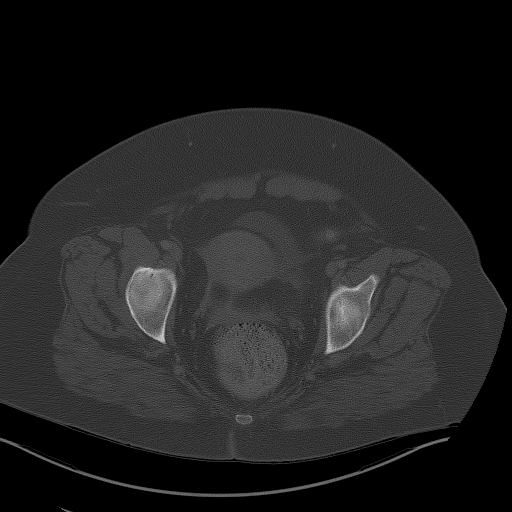

[Series 6: coronal st · coronal · 0.84mm/px · 3 of 117 slices shown]
[im 39/117  soft-tissue]
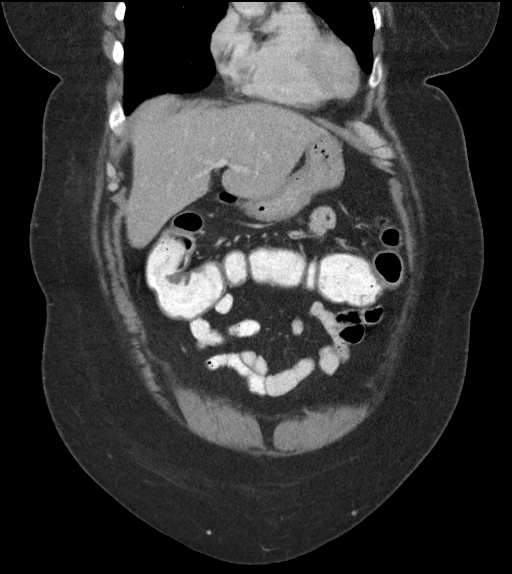
[im 52/117  soft-tissue]
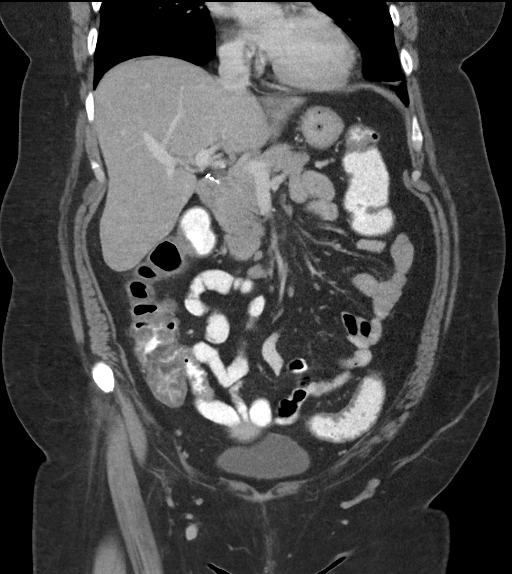
[im 65/117  soft-tissue]
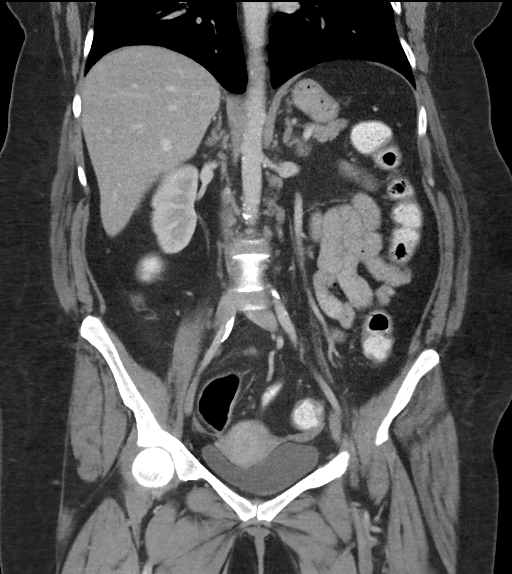

[16 of 46 positions shown; findings below may reference images not displayed]

RADIATION DOSE REDUCTION: This exam was performed according to the
departmental dose-optimization program which includes automated
exposure control, adjustment of the mA and/or kV according to
patient size and/or use of iterative reconstruction technique.

CONTRAST:  100mL OMNIPAQUE IOHEXOL 300 MG/ML  SOLN
FINDINGS: Lower chest: No acute abnormality.

Hepatobiliary: Wedge-shaped hypodensity in segment IV B along the
falciform ligament is in a location commonly associated with focal
fatty infiltration or altered perfusion. No suspicious hepatic
lesion. Gallbladder surgically absent. No biliary ductal dilation.

Pancreas: No pancreatic ductal dilation or evidence of acute
inflammation.

Spleen: No splenomegaly or focal splenic lesion.

Adrenals/Urinary Tract: Bilateral adrenal glands appear normal. No
hydronephrosis. Kidneys demonstrate symmetric enhancement and
excretion of contrast material. No solid enhancing renal mass.
Urinary bladder is unremarkable for degree of distension.

Stomach/Bowel: Radiopaque enteric contrast material traverses the
rectum. Stomach is minimally distended limiting evaluation. No
pathologic dilation of small large bowel. Terminal ileum appears
normal. The appendix is prominent measuring 8 mm with wall
thickening without significant adjacent inflammatory stranding. Gas
fluid levels throughout the colon suggestive of diarrheal illness.

Vascular/Lymphatic: Aortic and branch vessel atherosclerosis without
abdominal aortic aneurysm. No pathologically enlarged mediastinal,
hilar or axillary lymph nodes.

Reproductive: Uterus and bilateral adnexa are unremarkable.

Other: No significant abdominopelvic free fluid.

Musculoskeletal: No acute osseous abnormality.
IMPRESSION: 1. The appendix is prominent measuring 8 mm with wall thickening
without significant adjacent inflammatory stranding. Findings are
equivocal for early acute appendicitis.
2. Gas fluid levels throughout the colon suggestive of diarrheal
illness.
3.  Aortic Atherosclerosis ([id number]).

## 2023-06-28 ENCOUNTER — Other Ambulatory Visit (HOSPITAL_COMMUNITY): Payer: Self-pay | Admitting: Nurse Practitioner

## 2023-06-28 DIAGNOSIS — Z87891 Personal history of nicotine dependence: Secondary | ICD-10-CM

## 2023-06-29 ENCOUNTER — Encounter (INDEPENDENT_AMBULATORY_CARE_PROVIDER_SITE_OTHER): Payer: Self-pay | Admitting: *Deleted

## 2023-06-29 NOTE — Progress Notes (Signed)
 [facility] [address] [place] [address]   Clinic Day:  06/30/2023  Referring physician: Jonah Negus, NP  Patient Care Team: Jonah Negus, NP as PCP - General (Nurse Practitioner)   ASSESSMENT & PLAN:   Assessment:  1.  Leukocytosis: - Patient seen at the request of Zackary Heron, FNP - 06/16/2023: WBC-13.8, Hb-13.7, PLT-326, FAO-1308, MVH-8469, N 60%, L 30%, E 3% - 10/07/2022: WBC 13.7, ANC-8056, ALC-4192, N 62%, L 28%, M 5.3% - 04/27/2022: WBC-16.1, ANC 62952, ALC 4572, AMC 1030 - 12/15/2021: WBC-11.5, ANC 7222, ALC 3232, AMC 621 - CTAP (04/10/2021): No splenomegaly or adenopathy. - Reports night sweats (drenching) for the past 1 year, twice per week.  15 pound weight loss/3 months.  Appetite is normal.  No systemic steroids or topical steroid use.  No prior history of splenectomy.  Attained menarche at age 15.  2.  Social/family history: - She is on disability.  Current active smoker, 1 pack/day started at age 6. - No family history of leukemia.  Mother had breast and ovarian cancer.  2 maternal aunts had breast cancer.  Maternal grandmother had breast cancer.  Brother had stomach cancer.  Plan:  1.  Leukocytosis: - We have reviewed her labs over the years in detail. - Differential diagnosis includes chronic leukemia/smoking-related leukocytosis. - Recommend repeating CBC with LDH, ESR and CRP.  Recommend flow cytometry (leukemia/lymphoma panel).  Will also send myeloid NGS panel. - Will send JAK2 [id number] with reflex testing and BCR/ABL by FISH. - RTC 4 weeks for follow-up.  2.  Smoking history: - Last low-dose lung cancer CT scan can was in 2022.  She is scheduled for another scan next week.   No orders of the defined types were placed in this encounter.     Nadeen Augusta Teague,acting as a Neurosurgeon for Paulett Boros, MD.,have documented all relevant documentation on the behalf of Paulett Boros, MD,as directed by  Paulett Boros, MD while in the presence of Paulett Boros, MD.   I, Paulett Boros MD, have reviewed the above documentation for accuracy and completeness, and I agree with the above.   Paulett Boros, MD   6/4/20252:11 PM  CHIEF COMPLAINT/PURPOSE OF CONSULT:   Diagnosis: Leukocytosis  Current Therapy: Under workup  HISTORY OF PRESENT ILLNESS:   Monta is a 53 y.o. female presenting to clinic today for evaluation of leukocytosis at the request of Jonah Negus, NP.  Patient has a medical history of Hashimoto's disease, hypothyroidism, hypertension, hyperlipidemia, vitamin D deficiency, bilateral venous insufficiency causing edema, DM type II, and bipolar disorder. She has a surgical history of cholecystectomy, tubal ligation, and tonsillectomy.   Quatisha was seen by her PCP on 06/22/23 for regular follow-up with labs done 1 week prior. Her most recent BC diff from 06/16/23 showed elevated WBC at 13.8, elevated absolute neutrophils at 8377, and elevated absolute lymphocytes at 4264. Her WBC has remained elevated since at least 2023. TSH was normal on 06/16/23.  Today, she states that she is doing well overall. Her appetite level is at 100%. Her energy level is at 10%.  PAST MEDICAL HISTORY:   Past Medical History: Past Medical History:  Diagnosis Date   Bipolar 1 disorder (HCC)    Hypertension    Hypothyroidism     Surgical History: Past Surgical History:  Procedure Laterality Date   CHOLECYSTECTOMY     TONSILLECTOMY      Social History: Social History   Socioeconomic History  Marital status: Widowed    Spouse name: Not on file   Number of children: Not on file   Years of education: Not on file   Highest education level: Not on file  Occupational History   Not on file  Tobacco Use   Smoking status: Every Day    Current packs/day: 1.00    Types: Cigarettes   Smokeless tobacco: Never  Substance and Sexual Activity   Alcohol use: Not Currently     Comment: occasionally    Drug use: Yes    Types: Marijuana   Sexual activity: Not on file  Other Topics Concern   Not on file  Social History Narrative   Not on file   Social Drivers of Health   Financial Resource Strain: Not on file  Food Insecurity: Not on file  Transportation Needs: Not on file  Physical Activity: Not on file  Stress: Not on file  Social Connections: Not on file  Intimate Partner Violence: Not on file    Family History: No family history on file.  Current Medications:  Current Outpatient Medications:    atorvastatin (LIPITOR) 20 MG tablet, Take 20 mg by mouth daily., Disp: , Rfl:    Cholecalciferol (VITAMIN D-3) 1000 units CAPS, Take 2 capsules by mouth daily., Disp: , Rfl:    lamoTRIgine (LAMICTAL) 150 MG tablet, Take 150 mg by mouth daily., Disp: , Rfl:    levothyroxine  (SYNTHROID ) 200 MCG tablet, TAKE (1) TABLET DAILY FOR THYROID ., Disp: 30 tablet, Rfl: 6   magnesium gluconate (MAGONATE) 500 MG tablet, Take 500 mg by mouth 2 (two) times daily., Disp: , Rfl:    metFORMIN  (GLUCOPHAGE ) 500 MG tablet, TAKE 1 TABLET DAILY AFTER SUPPER, Disp: 90 tablet, Rfl: 0   omeprazole (PRILOSEC) 40 MG capsule, Take 40 mg by mouth daily., Disp: , Rfl:    SYNTHROID  25 MCG tablet, TAKE 1 TABLET BY MOUTH ONCE DAILY BEFORE BREAKFAST, Disp: 90 tablet, Rfl: 0   Allergies: Allergies  Allergen Reactions   Codeine Itching   Penicillins     REACTION: "doesn't help"    REVIEW OF SYSTEMS:   Review of Systems  Constitutional:  Negative for chills, fatigue and fever.  HENT:   Positive for trouble swallowing. Negative for lump/mass, mouth sores, nosebleeds and sore throat.   Eyes:  Negative for eye problems.  Respiratory:  Positive for cough and shortness of breath.   Cardiovascular:  Positive for palpitations. Negative for chest pain and leg swelling.  Gastrointestinal:  Positive for constipation. Negative for abdominal pain, diarrhea, nausea and vomiting.  Genitourinary:   Positive for frequency. Negative for bladder incontinence, difficulty urinating, dysuria, hematuria and nocturia.   Musculoskeletal:  Positive for arthralgias. Negative for back pain, flank pain, myalgias and neck pain.  Skin:  Negative for itching and rash.  Neurological:  Positive for numbness. Negative for dizziness and headaches.  Hematological:  Does not bruise/bleed easily.  Psychiatric/Behavioral:  Positive for depression and sleep disturbance. Negative for suicidal ideas. The patient is nervous/anxious.   All other systems reviewed and are negative.    VITALS:   Blood pressure 113/74, pulse 70, temperature 97.7 F (36.5 C), temperature source Oral, resp. rate 18, height 5\' 4"  (1.626 m), weight 211 lb 13.8 oz (96.1 kg), SpO2 95%.  Wt Readings from Last 3 Encounters:  06/30/23 211 lb 13.8 oz (96.1 kg)  04/11/21 215 lb (97.5 kg)  10/13/16 225 lb (102.1 kg)    Body mass index is 36.37 kg/m.   PHYSICAL  EXAM:   Physical Exam Vitals and nursing note reviewed. Exam conducted with a chaperone present.  Constitutional:      Appearance: Normal appearance.  Cardiovascular:     Rate and Rhythm: Normal rate and regular rhythm.     Pulses: Normal pulses.     Heart sounds: Normal heart sounds.  Pulmonary:     Effort: Pulmonary effort is normal.     Breath sounds: Normal breath sounds.  Abdominal:     Palpations: Abdomen is soft. There is no hepatomegaly, splenomegaly or mass.     Tenderness: There is no abdominal tenderness.  Musculoskeletal:     Right lower leg: No edema.     Left lower leg: No edema.  Lymphadenopathy:     Cervical: No cervical adenopathy.     Right cervical: No superficial, deep or posterior cervical adenopathy.    Left cervical: No superficial, deep or posterior cervical adenopathy.     Upper Body:     Right upper body: No supraclavicular or axillary adenopathy.     Left upper body: No supraclavicular or axillary adenopathy.  Neurological:     General: No  focal deficit present.     Mental Status: She is alert and oriented to person, place, and time.  Psychiatric:        Mood and Affect: Mood normal.        Behavior: Behavior normal.     LABS:   CBC    Component Value Date/Time   WBC 9.9 01/28/2009 2011   RBC 4.17 01/28/2009 2011   HGB 13.1 01/28/2009 2011   HCT 39.5 01/28/2009 2011   PLT 339 01/28/2009 2011   MCV 94.7 01/28/2009 2011   MCHC 33.2 01/28/2009 2011   RDW 13.6 01/28/2009 2011   LYMPHSABS 3.5 01/28/2009 2011   MONOABS 0.3 01/28/2009 2011   EOSABS 0.3 01/28/2009 2011   BASOSABS 0.1 01/28/2009 2011    CMP    Component Value Date/Time   NA 140 01/28/2009 2011   K 4.7 01/28/2009 2011   CL 106 01/28/2009 2011   CO2 22 01/28/2009 2011   GLUCOSE 86 01/28/2009 2011   BUN 8 01/28/2009 2011   CREATININE 0.80 04/10/2021 0928   CALCIUM 8.8 01/28/2009 2011   PROT 6.6 01/28/2009 2011   ALBUMIN 4.4 01/28/2009 2011   AST 14 01/28/2009 2011   ALT 22 01/28/2009 2011   ALKPHOS 89 01/28/2009 2011   BILITOT 0.3 01/28/2009 2011    No results found for: "CEA1", "CEA" / No results found for: "CEA1", "CEA" No results found for: "PSA1" No results found for: "ZOX096" No results found for: "CAN125"  No results found for: "TOTALPROTELP", "ALBUMINELP", "A1GS", "A2GS", "BETS", "BETA2SER", "GAMS", "MSPIKE", "SPEI" No results found for: "TIBC", "FERRITIN", "IRONPCTSAT" No results found for: "LDH"   STUDIES:   No results found.

## 2023-06-30 ENCOUNTER — Inpatient Hospital Stay: Attending: Hematology | Admitting: Hematology

## 2023-06-30 ENCOUNTER — Inpatient Hospital Stay

## 2023-06-30 VITALS — BP 113/74 | HR 70 | Temp 97.7°F | Resp 18 | Ht 64.0 in | Wt 211.9 lb

## 2023-06-30 DIAGNOSIS — D72829 Elevated white blood cell count, unspecified: Secondary | ICD-10-CM | POA: Diagnosis not present

## 2023-06-30 DIAGNOSIS — Z8041 Family history of malignant neoplasm of ovary: Secondary | ICD-10-CM | POA: Diagnosis not present

## 2023-06-30 DIAGNOSIS — Z803 Family history of malignant neoplasm of breast: Secondary | ICD-10-CM | POA: Insufficient documentation

## 2023-06-30 DIAGNOSIS — D649 Anemia, unspecified: Secondary | ICD-10-CM

## 2023-06-30 DIAGNOSIS — F17210 Nicotine dependence, cigarettes, uncomplicated: Secondary | ICD-10-CM | POA: Insufficient documentation

## 2023-06-30 DIAGNOSIS — R634 Abnormal weight loss: Secondary | ICD-10-CM | POA: Insufficient documentation

## 2023-06-30 DIAGNOSIS — R61 Generalized hyperhidrosis: Secondary | ICD-10-CM | POA: Diagnosis not present

## 2023-06-30 DIAGNOSIS — Z800 Family history of malignant neoplasm of digestive organs: Secondary | ICD-10-CM | POA: Diagnosis not present

## 2023-06-30 LAB — CBC WITH DIFFERENTIAL/PLATELET
Abs Immature Granulocytes: 0.05 10*3/uL (ref 0.00–0.07)
Basophils Absolute: 0.1 10*3/uL (ref 0.0–0.1)
Basophils Relative: 1 %
Eosinophils Absolute: 0.4 10*3/uL (ref 0.0–0.5)
Eosinophils Relative: 3 %
HCT: 40.9 % (ref 36.0–46.0)
Hemoglobin: 13.8 g/dL (ref 12.0–15.0)
Immature Granulocytes: 0 %
Lymphocytes Relative: 34 %
Lymphs Abs: 4.4 10*3/uL — ABNORMAL HIGH (ref 0.7–4.0)
MCH: 32.1 pg (ref 26.0–34.0)
MCHC: 33.7 g/dL (ref 30.0–36.0)
MCV: 95.1 fL (ref 80.0–100.0)
Monocytes Absolute: 0.7 10*3/uL (ref 0.1–1.0)
Monocytes Relative: 6 %
Neutro Abs: 7.5 10*3/uL (ref 1.7–7.7)
Neutrophils Relative %: 56 %
Platelets: 312 10*3/uL (ref 150–400)
RBC: 4.3 MIL/uL (ref 3.87–5.11)
RDW: 13.4 % (ref 11.5–15.5)
WBC: 13.2 10*3/uL — ABNORMAL HIGH (ref 4.0–10.5)
nRBC: 0 % (ref 0.0–0.2)

## 2023-06-30 LAB — SEDIMENTATION RATE: Sed Rate: 20 mm/h (ref 0–30)

## 2023-06-30 LAB — LACTATE DEHYDROGENASE: LDH: 145 U/L (ref 98–192)

## 2023-06-30 LAB — C-REACTIVE PROTEIN: CRP: 0.7 mg/dL (ref <1.0)

## 2023-06-30 NOTE — Patient Instructions (Signed)
 You were seen and examined today by Dr. Cheree Cords. Dr. Cheree Cords is a hematologist, meaning that he specializes in blood abnormalities. Dr. Cheree Cords discussed your past medical history, family history of cancers/blood conditions and the events that led to you being here today.  You were referred to Dr. Cheree Cords due to leukocytosis (elevated white count).  Dr. Katragadda has recommended additional labs today for further evaluation.  Follow-up as scheduled.

## 2023-07-02 LAB — SURGICAL PATHOLOGY

## 2023-07-05 LAB — BCR-ABL1 FISH
Cells Analyzed: 200
Cells Counted: 200

## 2023-07-05 LAB — FLOW CYTOMETRY

## 2023-07-07 LAB — CALR +MPL + E12-E15  (REFLEX)

## 2023-07-07 LAB — JAK2 V617F RFX CALR/MPL/E12-15

## 2023-07-09 ENCOUNTER — Ambulatory Visit (HOSPITAL_COMMUNITY): Admission: RE | Admit: 2023-07-09 | Source: Ambulatory Visit

## 2023-07-09 ENCOUNTER — Encounter (HOSPITAL_COMMUNITY): Payer: Self-pay

## 2023-07-27 LAB — MISC LABCORP TEST (SEND OUT): Labcorp test code: 452312

## 2023-07-27 NOTE — Progress Notes (Signed)
 [facility] [address] [place] [address]   Clinic Day:  07/28/2023  Referring physician: Johnson Morna FALCON, NP  Patient Care Team: Johnson Morna FALCON, NP as PCP - General (Nurse Practitioner)   ASSESSMENT & PLAN:   Assessment:  1.  Leukocytosis: - Patient seen at the request of Morna Johnson, FNP - 06/16/2023: WBC-13.8, Hb-13.7, PLT-326, JWR-1622, JOR-5735, N 60%, L 30%, E 3% - 10/07/2022: WBC 13.7, ANC-8056, ALC-4192, N 62%, L 28%, M 5.3% - 04/27/2022: WBC-16.1, ANC 89985, ALC 4572, AMC 1030 - 12/15/2021: WBC-11.5, ANC 7222, ALC 3232, AMC 621 - CTAP (04/10/2021): No splenomegaly or adenopathy. - Reports night sweats (drenching) for the past 1 year, twice per week.  15 pound weight loss/3 months.  Appetite is normal.  No systemic steroids or topical steroid use.  No prior history of splenectomy.  Attained menarche at age 1. - 06/30/2023: Negative for JAK2 V6 1 7 and extended testing: BCR/ABL.  Flow cytometry was negative for B or T-cell lymphoproliferative disorders.  NGS panel did not show any significant abnormalities.  Inflammatory markers were normal.  2.  Social/family history: - She is on disability.  Current active smoker, 1 pack/day started at age 44. - No family history of leukemia.  Mother had breast and ovarian cancer.  2 maternal aunts had breast cancer.  Maternal grandmother had breast cancer.  Brother had stomach cancer.  Plan:  1.  Leukocytosis: - We have reviewed labs from 06/30/2023: White count is elevated at 13.2, predominantly lymphocytes on differential.  Inflammatory markers were negative.  MPN workup including JAK2 V617F and BCR/ABL is negative.  Myeloid NGS panel did not show any significant mutations. 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 - Recommend follow-up in 1 year with repeat labs.  If there is any significant changes, will consider bone marrow biopsy.  2.  Smoking history: - Last low-dose CT scan was in 2022.  She  is scheduled for another scan soon.   Orders Placed This Encounter  Procedures   CBC with Differential    Standing Status:   Future    Expected Date:   07/27/2024    Expiration Date:   07/27/2024   Lactate dehydrogenase    Standing Status:   Future    Expected Date:   07/27/2024    Expiration Date:   07/27/2024      LILLETTE Hummingbird R Teague,acting as a scribe for [name], MD.,have documented all relevant documentation on the behalf of [name], MD,as directed by  [name], MD while in the presence of [name], MD.  I, [name] MD, have reviewed the above documentation for accuracy and completeness, and I agree with the above.    [name], MD   7/2/20252:47 PM  CHIEF COMPLAINT/PURPOSE OF CONSULT:   Diagnosis: Leukocytosis  Current Therapy: Under workup  HISTORY OF PRESENT ILLNESS:   Eileen [name] is a 53 y.o. female presenting to clinic today for evaluation of leukocytosis at the request of Johnson Morna FALCON, NP.  Patient has a medical history of Hashimoto's disease, hypothyroidism, hypertension, hyperlipidemia, vitamin D deficiency, bilateral venous insufficiency causing edema, DM type II, and bipolar disorder. She has a surgical history of cholecystectomy, tubal ligation, and tonsillectomy.   Summerlyn was seen by her PCP on 06/22/23 for regular follow-up with labs done 1 week prior. Her most recent BC diff from 06/16/23 showed elevated WBC at 13.8, elevated absolute neutrophils at 8377, and elevated absolute lymphocytes at 4264. Her WBC has remained  elevated since at least 2023. TSH was normal on 06/16/23.  Today, she states that she is doing well overall. Her appetite level is at 100%. Her energy level is at 10%.  INTERVAL HISTORY:   Eileen [name] is a 53 y.o. female presenting to the clinic today for follow-up of leukocytosis. She was last seen by me on 06/30/23 in consultation.  Today, she states that she is doing well overall.  Her appetite level is at 80%. Her energy level is at 25%.   PAST MEDICAL HISTORY:   Past Medical History: Past Medical History:  Diagnosis Date   Bipolar 1 disorder (HCC)    Hypertension    Hypothyroidism     Surgical History: Past Surgical History:  Procedure Laterality Date   CHOLECYSTECTOMY     TONSILLECTOMY      Social History: Social History   Socioeconomic History   Marital status: Widowed    Spouse name: Not on file   Number of children: Not on file   Years of education: Not on file   Highest education level: Not on file  Occupational History   Not on file  Tobacco Use   Smoking status: Every Day    Current packs/day: 1.00    Types: Cigarettes   Smokeless tobacco: Never  Substance and Sexual Activity   Alcohol use: Not Currently    Comment: occasionally    Drug use: Yes    Types: Marijuana   Sexual activity: Not on file  Other Topics Concern   Not on file  Social History Narrative   Not on file   Social Drivers of Health   Financial Resource Strain: Not on file  Food Insecurity: Food Insecurity Present (06/30/2023)   Hunger Vital Sign    Worried About Running Out of Food in the Last Year: Sometimes true    Ran Out of Food in the Last Year: Sometimes true  Transportation Needs: No Transportation Needs (06/30/2023)   PRAPARE - Administrator, Civil Service (Medical): No    Lack of Transportation (Non-Medical): No  Physical Activity: Not on file  Stress: Not on file  Social Connections: Not on file  Intimate Partner Violence: Not At Risk (06/30/2023)   Humiliation, Afraid, Rape, and Kick questionnaire    Fear of Current or Ex-Partner: No    Emotionally Abused: No    Physically Abused: No    Sexually Abused: No    Family History: No family history on file.  Current Medications:  Current Outpatient Medications:    atenolol (TENORMIN) 25 MG tablet, Take 12.5 mg by mouth daily., Disp: , Rfl:    atorvastatin (LIPITOR) 20 MG tablet, Take  20 mg by mouth daily., Disp: , Rfl:    Cholecalciferol (VITAMIN D-3) 125 MCG (5000 UT) TABS, Take by mouth., Disp: , Rfl:    cyclobenzaprine (FLEXERIL) 10 MG tablet, Takes as needed, Disp: , Rfl:    DULoxetine (CYMBALTA) 20 MG capsule, Take 20 mg by mouth daily., Disp: , Rfl:    DULoxetine (CYMBALTA) 60 MG capsule, Take by mouth daily., Disp: , Rfl:    JANUMET 50-500 MG tablet, Take 1 tablet by mouth 2 (two) times daily., Disp: , Rfl:    lamoTRIgine (LAMICTAL) 150 MG tablet, Take 150 mg by mouth daily., Disp: , Rfl:    LINZESS 145 MCG CAPS capsule, Take 145 mcg by mouth every morning., Disp: , Rfl:    magnesium gluconate (MAGONATE) 500 MG tablet, Take 500 mg by mouth 2 (two)  times daily., Disp: , Rfl:    Magnesium Oxide -Mg Supplement 500 MG CAPS, 1 tablet, Disp: , Rfl:    meloxicam (MOBIC) 15 MG tablet, Take 15 mg by mouth daily., Disp: , Rfl:    omeprazole (PRILOSEC) 40 MG capsule, Take 40 mg by mouth daily., Disp: , Rfl:    pregabalin (LYRICA) 200 MG capsule, Take 200 mg by mouth 2 (two) times daily., Disp: , Rfl:    sucralfate (CARAFATE) 1 g tablet, As needed, Disp: , Rfl:    SYNTHROID  112 MCG tablet, Take 224 mcg by mouth daily., Disp: , Rfl:    traMADol (ULTRAM) 50 MG tablet, 1 tablet as needed Orally 2x per day as needed for 30 days, Disp: , Rfl:    VITAMIN B COMPLEX-C PO, , Disp: , Rfl:    Allergies: Allergies  Allergen Reactions   Codeine Itching   Penicillins     REACTION: doesn't help    REVIEW OF SYSTEMS:   Review of Systems  Constitutional:  Negative for chills, fatigue and fever.  HENT:   Negative for lump/mass, mouth sores, nosebleeds, sore throat and trouble swallowing.   Eyes:  Negative for eye problems.  Respiratory:  Positive for cough. Negative for shortness of breath.   Cardiovascular:  Negative for chest pain, leg swelling and palpitations.  Gastrointestinal:  Positive for constipation and nausea. Negative for abdominal pain, diarrhea and vomiting.   Genitourinary:  Negative for bladder incontinence, difficulty urinating, dysuria, frequency, hematuria and nocturia.   Musculoskeletal:  Negative for arthralgias, back pain, flank pain, myalgias and neck pain.  Skin:  Negative for itching and rash.  Neurological:  Positive for numbness. Negative for dizziness and headaches.  Hematological:  Does not bruise/bleed easily.  Psychiatric/Behavioral:  Positive for depression. Negative for sleep disturbance and suicidal ideas. The patient is nervous/anxious.   All other systems reviewed and are negative.    VITALS:   Blood pressure (!) 148/78, pulse 68, temperature 98.8 F (37.1 C), temperature source Tympanic, resp. rate 20, weight 214 lb 1.1 oz (97.1 kg), SpO2 98%.  Wt Readings from Last 3 Encounters:  07/28/23 214 lb 1.1 oz (97.1 kg)  06/30/23 211 lb 13.8 oz (96.1 kg)  04/11/21 215 lb (97.5 kg)    Body mass index is 36.74 kg/m.   PHYSICAL EXAM:   Physical Exam Vitals and nursing note reviewed. Exam conducted with a chaperone present.  Constitutional:      Appearance: Normal appearance.  Cardiovascular:     Rate and Rhythm: Normal rate and regular rhythm.     Pulses: Normal pulses.     Heart sounds: Normal heart sounds.  Pulmonary:     Effort: Pulmonary effort is normal.     Breath sounds: Normal breath sounds.  Abdominal:     Palpations: Abdomen is soft. There is no hepatomegaly, splenomegaly or mass.     Tenderness: There is no abdominal tenderness.  Musculoskeletal:     Right lower leg: No edema.     Left lower leg: No edema.  Lymphadenopathy:     Cervical: No cervical adenopathy.     Right cervical: No superficial, deep or posterior cervical adenopathy.    Left cervical: No superficial, deep or posterior cervical adenopathy.     Upper Body:     Right upper body: No supraclavicular or axillary adenopathy.     Left upper body: No supraclavicular or axillary adenopathy.  Neurological:     General: No focal deficit  present.  Mental Status: She is alert and oriented to person, place, and time.  Psychiatric:        Mood and Affect: Mood normal.        Behavior: Behavior normal.     LABS:   CBC    Component Value Date/Time   WBC 13.2 (H) 06/30/2023 1449   RBC 4.30 06/30/2023 1449   HGB 13.8 06/30/2023 1449   HCT 40.9 06/30/2023 1449   PLT 312 06/30/2023 1449   MCV 95.1 06/30/2023 1449   MCH 32.1 06/30/2023 1449   MCHC 33.7 06/30/2023 1449   RDW 13.4 06/30/2023 1449   LYMPHSABS 4.4 (H) 06/30/2023 1449   MONOABS 0.7 06/30/2023 1449   EOSABS 0.4 06/30/2023 1449   BASOSABS 0.1 06/30/2023 1449    CMP    Component Value Date/Time   NA 140 01/28/2009 2011   K 4.7 01/28/2009 2011   CL 106 01/28/2009 2011   CO2 22 01/28/2009 2011   GLUCOSE 86 01/28/2009 2011   BUN 8 01/28/2009 2011   CREATININE 0.80 04/10/2021 0928   CALCIUM 8.8 01/28/2009 2011   PROT 6.6 01/28/2009 2011   ALBUMIN 4.4 01/28/2009 2011   AST 14 01/28/2009 2011   ALT 22 01/28/2009 2011   ALKPHOS 89 01/28/2009 2011   BILITOT 0.3 01/28/2009 2011    No results found for: CEA1, CEA / No results found for: CEA1, CEA No results found for: PSA1 No results found for: CAN199 No results found for: CAN125  No results found for: TOTALPROTELP, ALBUMINELP, A1GS, A2GS, BETS, BETA2SER, GAMS, MSPIKE, SPEI No results found for: TIBC, FERRITIN, IRONPCTSAT Lab Results  Component Value Date   LDH 145 06/30/2023     STUDIES:   No results found.

## 2023-07-28 ENCOUNTER — Other Ambulatory Visit (HOSPITAL_COMMUNITY): Payer: Self-pay | Admitting: Nurse Practitioner

## 2023-07-28 ENCOUNTER — Inpatient Hospital Stay: Attending: Hematology | Admitting: Hematology

## 2023-07-28 ENCOUNTER — Inpatient Hospital Stay: Admitting: Licensed Clinical Social Worker

## 2023-07-28 VITALS — BP 148/78 | HR 68 | Temp 98.8°F | Resp 20 | Wt 214.1 lb

## 2023-07-28 DIAGNOSIS — Z803 Family history of malignant neoplasm of breast: Secondary | ICD-10-CM | POA: Diagnosis not present

## 2023-07-28 DIAGNOSIS — N63 Unspecified lump in unspecified breast: Secondary | ICD-10-CM

## 2023-07-28 DIAGNOSIS — Z8041 Family history of malignant neoplasm of ovary: Secondary | ICD-10-CM | POA: Insufficient documentation

## 2023-07-28 DIAGNOSIS — Z800 Family history of malignant neoplasm of digestive organs: Secondary | ICD-10-CM | POA: Diagnosis not present

## 2023-07-28 DIAGNOSIS — D72829 Elevated white blood cell count, unspecified: Secondary | ICD-10-CM | POA: Insufficient documentation

## 2023-07-28 DIAGNOSIS — F17210 Nicotine dependence, cigarettes, uncomplicated: Secondary | ICD-10-CM | POA: Diagnosis not present

## 2023-07-28 NOTE — Patient Instructions (Signed)
 [facility] at [facility] Discharge Instructions   You were seen and examined today by Dr. Rogers.  He reviewed the results of your lab work which are normal/stable. The reason for your elevated white blood cells is likely secondary to your smoking history.   We will see you back in 1 year. We will recheck your blood at that time.   Return as scheduled.    Thank you for choosing [facility] at [facility] to provide your oncology and hematology care.  To afford each patient quality time with our provider, please arrive at least 15 minutes before your scheduled appointment time.   If you have a lab appointment with the Cancer Center please come in thru the Main Entrance and check in at the main information desk.  You need to re-schedule your appointment should you arrive 10 or more minutes late.  We strive to give you quality time with our providers, and arriving late affects you and other patients whose appointments are after yours.  Also, if you no show three or more times for appointments you may be dismissed from the clinic at the providers discretion.     Again, thank you for choosing [facility].  Our hope is that these requests will decrease the amount of time that you wait before being seen by our physicians.       _____________________________________________________________  Should you have questions after your visit to [facility], please contact our office at [phone] and follow the prompts.  Our office hours are 8:00 a.m. and 4:30 p.m. Monday - Friday.  Please note that voicemails left after 4:00 p.m. may not be returned until the following business day.  We are closed weekends and major holidays.  You do have access to a nurse 24-7, just call the main number to the clinic [phone] and do not press any options, hold on the line and a nurse will answer the phone.    For prescription refill requests,  have your pharmacy contact our office and allow 72 hours.    Due to Covid, you will need to wear a mask upon entering the hospital. If you do not have a mask, a mask will be given to you at the Main Entrance upon arrival. For doctor visits, patients may have 1 support person age 60 or older with them. For treatment visits, patients can not have anyone with them due to social distancing guidelines and our immunocompromised population.

## 2023-08-09 ENCOUNTER — Encounter (HOSPITAL_COMMUNITY): Payer: Self-pay | Admitting: Nurse Practitioner

## 2023-08-09 DIAGNOSIS — N63 Unspecified lump in unspecified breast: Secondary | ICD-10-CM

## 2023-08-10 ENCOUNTER — Inpatient Hospital Stay
Admission: RE | Admit: 2023-08-10 | Discharge: 2023-08-10 | Disposition: A | Discharge status: Home / Self Care | Payer: Self-pay | Source: Ambulatory Visit | Attending: Nurse Practitioner | Admitting: Nurse Practitioner

## 2023-08-10 ENCOUNTER — Other Ambulatory Visit (HOSPITAL_COMMUNITY): Payer: Self-pay | Admitting: Nurse Practitioner

## 2023-08-10 DIAGNOSIS — N63 Unspecified lump in unspecified breast: Secondary | ICD-10-CM

## 2023-08-26 ENCOUNTER — Ambulatory Visit (HOSPITAL_COMMUNITY)
Admission: RE | Admit: 2023-08-26 | Discharge: 2023-08-26 | Disposition: A | Discharge status: Home / Self Care | Source: Ambulatory Visit | Attending: Nurse Practitioner | Admitting: Nurse Practitioner

## 2023-08-26 ENCOUNTER — Encounter (HOSPITAL_COMMUNITY): Payer: Self-pay

## 2023-08-26 DIAGNOSIS — N63 Unspecified lump in unspecified breast: Secondary | ICD-10-CM

## 2023-12-29 ENCOUNTER — Encounter (HOSPITAL_BASED_OUTPATIENT_CLINIC_OR_DEPARTMENT_OTHER): Payer: Self-pay | Admitting: Internal Medicine

## 2023-12-29 ENCOUNTER — Encounter (INDEPENDENT_AMBULATORY_CARE_PROVIDER_SITE_OTHER): Payer: Self-pay | Admitting: *Deleted

## 2023-12-29 DIAGNOSIS — G4710 Hypersomnia, unspecified: Secondary | ICD-10-CM

## 2023-12-29 DIAGNOSIS — G479 Sleep disorder, unspecified: Secondary | ICD-10-CM

## 2023-12-29 DIAGNOSIS — R5383 Other fatigue: Secondary | ICD-10-CM

## 2023-12-29 DIAGNOSIS — R0683 Snoring: Secondary | ICD-10-CM

## 2024-07-26 ENCOUNTER — Other Ambulatory Visit

## 2024-07-26 ENCOUNTER — Ambulatory Visit: Admitting: Physician Assistant
